# Patient Record
Sex: Male | Born: 1965
Health system: Southern US, Community
[De-identification: ages and names within clinical notes are randomized; demographics above are authoritative.]

## PROBLEM LIST (undated history)

## (undated) DIAGNOSIS — G4730 Sleep apnea, unspecified: Secondary | ICD-10-CM

## (undated) DIAGNOSIS — I10 Essential (primary) hypertension: Secondary | ICD-10-CM

## (undated) DIAGNOSIS — I514 Myocarditis, unspecified: Secondary | ICD-10-CM

## (undated) DIAGNOSIS — N529 Male erectile dysfunction, unspecified: Secondary | ICD-10-CM

## (undated) DIAGNOSIS — G43909 Migraine, unspecified, not intractable, without status migrainosus: Secondary | ICD-10-CM

## (undated) DIAGNOSIS — R7303 Prediabetes: Secondary | ICD-10-CM

## (undated) DIAGNOSIS — M109 Gout, unspecified: Secondary | ICD-10-CM

## (undated) DIAGNOSIS — M2550 Pain in unspecified joint: Secondary | ICD-10-CM

## (undated) HISTORY — DX: Male erectile dysfunction, unspecified: N52.9

## (undated) HISTORY — DX: Sleep apnea, unspecified: G47.30

## (undated) HISTORY — DX: Prediabetes: R73.03

## (undated) HISTORY — DX: Pain in unspecified joint: M25.50

## (undated) HISTORY — DX: Gout, unspecified: M10.9

## (undated) HISTORY — PX: PATELLA FRACTURE SURGERY: SHX735

## (undated) HISTORY — DX: Migraine, unspecified, not intractable, without status migrainosus: G43.909

---

## 2000-09-17 ENCOUNTER — Emergency Department (HOSPITAL_COMMUNITY): Admission: EM | Admit: 2000-09-17 | Discharge: 2000-09-17 | Payer: Self-pay | Admitting: Emergency Medicine

## 2000-09-27 ENCOUNTER — Emergency Department (HOSPITAL_COMMUNITY): Admission: EM | Admit: 2000-09-27 | Discharge: 2000-09-27 | Payer: Self-pay | Admitting: Emergency Medicine

## 2007-12-19 ENCOUNTER — Emergency Department (HOSPITAL_COMMUNITY): Admission: EM | Admit: 2007-12-19 | Discharge: 2007-12-19 | Payer: Self-pay | Admitting: Emergency Medicine

## 2008-03-08 ENCOUNTER — Emergency Department (HOSPITAL_COMMUNITY): Admission: EM | Admit: 2008-03-08 | Discharge: 2008-03-09 | Payer: Self-pay | Admitting: Emergency Medicine

## 2011-07-03 ENCOUNTER — Encounter: Payer: Self-pay | Admitting: Emergency Medicine

## 2011-07-03 ENCOUNTER — Emergency Department (INDEPENDENT_AMBULATORY_CARE_PROVIDER_SITE_OTHER): Payer: 59

## 2011-07-03 ENCOUNTER — Emergency Department (HOSPITAL_BASED_OUTPATIENT_CLINIC_OR_DEPARTMENT_OTHER)
Admission: EM | Admit: 2011-07-03 | Discharge: 2011-07-03 | Disposition: A | Discharge status: Home / Self Care | Payer: 59 | Attending: Emergency Medicine | Admitting: Emergency Medicine

## 2011-07-03 DIAGNOSIS — I10 Essential (primary) hypertension: Secondary | ICD-10-CM | POA: Insufficient documentation

## 2011-07-03 DIAGNOSIS — M25569 Pain in unspecified knee: Secondary | ICD-10-CM | POA: Insufficient documentation

## 2011-07-03 DIAGNOSIS — M25562 Pain in left knee: Secondary | ICD-10-CM

## 2011-07-03 HISTORY — DX: Essential (primary) hypertension: I10

## 2011-07-03 MED ORDER — NAPROXEN 500 MG PO TABS: 500.0000 mg | ORAL_TABLET | Freq: Two times a day (BID) | ORAL | Status: AC

## 2011-07-03 MED ORDER — HYDROCODONE-ACETAMINOPHEN 5-325 MG PO TABS: 1.0000 | ORAL_TABLET | Freq: Four times a day (QID) | ORAL | Status: AC | PRN

## 2011-07-03 NOTE — ED Notes (Signed)
Pt c/o LT knee pain x 4-5 days; no known recent injury; hx of LT knee fx

## 2011-07-03 NOTE — ED Provider Notes (Signed)
Scribed for Calvin Jakes, MD, the patient was seen in room MH12/MH12 . This chart was scribed by Ellie Lunch. This patient's care was started at 3:18 PM.   CSN: [id number] Arrival date & time: 07/03/2011  2:34 PM  Chief Complaint  Patient presents with  . Knee Pain   HPI  Calvin Hancock is a 45 y.o. male who presents to the Emergency Department complaining of left knee pain starting five days ago without any reported trauma. Pain is described as an "ache" that is currently 8/10 and 10/10 at its worst. Patient reports a history of a left fractured patella in 1995 and states symptoms are similar. Walking, standing, and flexion exacerbates pain. Pain is improved with rest and an antiinflammatory (Rxd for ankle pain by PCP Dr. Tiburcio Pea.   Additionally, pt reports No H/A, CP, No SOB, no abd pain, back neck pain, no hematuria, and no edema.   HPI ELEMENTS:  Location: left knee  Onset: 5 days ago Timing: waxing and waning  Quality: "ache"  Severity: 10/10 at worse, current 8/10  Modifying factors: walking, standing, flexion exacerbates, rest improves Context:  as above    Past Medical History  Diagnosis Date  . Hypertension     History reviewed. No pertinent past surgical history.  No family history on file.  History  Substance Use Topics  . Smoking status: Never Smoker   . Smokeless tobacco: Not on file  . Alcohol Use: Yes     social      Review of Systems 10 Systems reviewed and are negative for acute change except as noted in the HPI.  Physical Exam  BP 141/94  Pulse 72  Temp(Src) 97.8 F (36.6 C) (Oral)  Resp 16  SpO2 99%  Physical Exam  Constitutional: He is oriented to person, place, and time. He appears well-developed and well-nourished. No distress.  HENT:  Head: Normocephalic and atraumatic.  Eyes: EOM are normal. Pupils are equal, round, and reactive to light.  Cardiovascular: Normal rate, regular rhythm and normal heart sounds.   Abdominal: Soft.  Bowel sounds are normal. There is no tenderness.  Musculoskeletal:       Slight swelling left knee. Left quadricep tendon tender. Left patella moves freely. No numbness in left foot.  Lymphadenopathy:    He has no cervical adenopathy.  Neurological: He is alert and oriented to person, place, and time. No cranial nerve deficit.   OTHER DATA REVIEWED: Nursing notes, vital signs, and past medical records reviewed.   DIAGNOSTIC STUDIES: Oxygen Saturation is 99% on room air, normal by my interpretation.     LABS / RADIOLOGY:  No results found for this or any previous visit. Dg Knee Complete 4 Views Left  07/03/2011  *RADIOLOGY REPORT*  Clinical Data: Left knee pain, no trauma  LEFT KNEE - COMPLETE 4+ VIEW  Comparison: None.  Findings: Small suprapatellar effusion noted.  No fracture or dislocation.  No radiopaque foreign body.  IMPRESSION: No focal acute finding.  Original Report Authenticated By: Harrel Lemon, M.D.    ED COURSE / COORDINATION OF CARE: 16:32 EDP at bedside to discuss radiology report with PT.   MDM: PETELLAR TENDON IRRITATION. NO BONE INJURY  IMPRESSION: Diagnoses that have been ruled out:  Diagnoses that are still under consideration:  Final diagnoses:    PLAN:  Home  The patient is to return the emergency department if there is any worsening of symptoms. I have reviewed the discharge instructions with the patient  CONDITION  ON DISCHARGE: Stable  MEDICATIONS GIVEN IN THE E.D.  Medications  Amlodipine-Olmesartan (AZOR PO) (not administered)  amLODipine-olmesartan (AZOR) 5-40 MG per tablet (not administered)  meloxicam (MOBIC) 15 MG tablet (not administered)    DISCHARGE MEDICATIONS: New Prescriptions   No medications on file    Procedures      I personally performed the services described in this documentation, which was scribed in my presence. The recorded information has been reviewed and considered. Calvin Jakes, MD    Calvin Jakes, MD 07/03/11 [phone]

## 2013-08-29 ENCOUNTER — Ambulatory Visit
Admission: RE | Admit: 2013-08-29 | Discharge: 2013-08-29 | Disposition: A | Discharge status: Home / Self Care | Payer: PRIVATE HEALTH INSURANCE | Source: Ambulatory Visit | Attending: Family Medicine | Admitting: Family Medicine

## 2013-08-29 ENCOUNTER — Other Ambulatory Visit: Payer: Self-pay | Admitting: Family Medicine

## 2013-08-29 DIAGNOSIS — M25572 Pain in left ankle and joints of left foot: Secondary | ICD-10-CM

## 2013-12-18 ENCOUNTER — Ambulatory Visit
Admission: RE | Admit: 2013-12-18 | Discharge: 2013-12-18 | Disposition: A | Discharge status: Home / Self Care | Payer: BC Managed Care – PPO | Source: Ambulatory Visit | Attending: Family Medicine | Admitting: Family Medicine

## 2013-12-18 ENCOUNTER — Other Ambulatory Visit: Payer: Self-pay | Admitting: Family Medicine

## 2013-12-18 DIAGNOSIS — M25561 Pain in right knee: Secondary | ICD-10-CM

## 2013-12-18 DIAGNOSIS — M25571 Pain in right ankle and joints of right foot: Secondary | ICD-10-CM

## 2014-07-25 ENCOUNTER — Encounter: Payer: Self-pay | Admitting: *Deleted

## 2015-09-01 ENCOUNTER — Ambulatory Visit
Admission: RE | Admit: 2015-09-01 | Discharge: 2015-09-01 | Disposition: A | Discharge status: Home / Self Care | Payer: BLUE CROSS/BLUE SHIELD | Source: Ambulatory Visit | Attending: Family Medicine | Admitting: Family Medicine

## 2015-09-01 ENCOUNTER — Other Ambulatory Visit: Payer: Self-pay | Admitting: Family Medicine

## 2015-09-01 DIAGNOSIS — R0602 Shortness of breath: Secondary | ICD-10-CM

## 2015-12-14 ENCOUNTER — Ambulatory Visit (INDEPENDENT_AMBULATORY_CARE_PROVIDER_SITE_OTHER)
Admission: RE | Admit: 2015-12-14 | Discharge: 2015-12-14 | Disposition: A | Discharge status: Home / Self Care | Payer: BLUE CROSS/BLUE SHIELD | Source: Ambulatory Visit | Attending: Internal Medicine | Admitting: Internal Medicine

## 2015-12-14 ENCOUNTER — Encounter: Payer: Self-pay | Admitting: Internal Medicine

## 2015-12-14 ENCOUNTER — Ambulatory Visit (INDEPENDENT_AMBULATORY_CARE_PROVIDER_SITE_OTHER): Payer: BLUE CROSS/BLUE SHIELD | Admitting: Internal Medicine

## 2015-12-14 ENCOUNTER — Other Ambulatory Visit (INDEPENDENT_AMBULATORY_CARE_PROVIDER_SITE_OTHER): Payer: BLUE CROSS/BLUE SHIELD

## 2015-12-14 VITALS — BP 146/82 | HR 71 | Ht 70.5 in | Wt 320.0 lb

## 2015-12-14 DIAGNOSIS — R0600 Dyspnea, unspecified: Secondary | ICD-10-CM

## 2015-12-14 LAB — BASIC METABOLIC PANEL
BUN: 15 mg/dL (ref 6–23)
CO2: 29 mEq/L (ref 19–32)
Calcium: 9 mg/dL (ref 8.4–10.5)
Chloride: 105 mEq/L (ref 96–112)
Creatinine, Ser: 1.12 mg/dL (ref 0.40–1.50)
GFR: 90.14 mL/min (ref >60.00)
Glucose, Bld: 95 mg/dL (ref 70–99)
Potassium: 4.1 mEq/L (ref 3.5–5.1)
Sodium: 141 mEq/L (ref 135–145)

## 2015-12-14 LAB — CBC WITH DIFFERENTIAL/PLATELET
Basophils Absolute: 0 10*3/uL (ref 0.0–0.1)
Basophils Relative: 0.5 % (ref 0.0–3.0)
Eosinophils Absolute: 0.1 10*3/uL (ref 0.0–0.7)
Eosinophils Relative: 1.6 % (ref 0.0–5.0)
HCT: 46 % (ref 39.0–52.0)
Hemoglobin: 14.9 g/dL (ref 13.0–17.0)
Lymphocytes Relative: 42.2 % (ref 12.0–46.0)
Lymphs Abs: 2.7 10*3/uL (ref 0.7–4.0)
MCHC: 32.5 g/dL (ref 30.0–36.0)
MCV: 84.9 fl (ref 78.0–100.0)
Monocytes Absolute: 0.5 10*3/uL (ref 0.1–1.0)
Monocytes Relative: 8.4 % (ref 3.0–12.0)
Neutro Abs: 3 10*3/uL (ref 1.4–7.7)
Neutrophils Relative %: 47.3 % (ref 43.0–77.0)
Platelets: 260 10*3/uL (ref 150.0–400.0)
RBC: 5.42 Mil/uL (ref 4.22–5.81)
RDW: 15.3 % (ref 11.5–15.5)
WBC: 6.4 10*3/uL (ref 4.0–10.5)

## 2015-12-14 LAB — TSH: TSH: 1.43 u[IU]/mL (ref 0.35–4.50)

## 2015-12-14 LAB — D-DIMER, QUANTITATIVE: D-Dimer, Quant: 0.27 ug/mL-FEU (ref 0.00–0.48)

## 2015-12-14 LAB — BRAIN NATRIURETIC PEPTIDE: Pro B Natriuretic peptide (BNP): 8 pg/mL (ref 0.0–100.0)

## 2015-12-14 NOTE — Progress Notes (Signed)
Quick Note:  Spoke with pt and notified of results per Dr. Wert. Pt verbalized understanding and denied any questions.  ______ 

## 2015-12-14 NOTE — Progress Notes (Signed)
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 Advice Only    Referred by Dr. Tiburcio Pea for increased SOB Xfew months.  pt states this is worse with exertion, especially stairs.   no longer exerting at all/ knee slows him down more than anything / wears cpap and wakes up feeling good when uses it   No obvious  day to day or daytime variabilty or assoc chronic cough or cp or chest tightness, subjective wheeze overt sinus or hb symptoms. No unusual exp hx or h/o childhood pna/ asthma or knowledge of premature birth.  Sleeping ok without nocturnal  or early am exacerbation  of respiratory  c/o's or need for noct saba. Also denies any obvious fluctuation of symptoms with weather or environmental changes or other aggravating or alleviating factors except as outlined above   Current Medications, Allergies, Complete Past Medical History, Past Surgical History, Family History, and Social History were reviewed in [employer] record.           Review of Systems  Constitutional: Negative for fever and unexpected weight change.  HENT: Negative for congestion, dental problem, ear pain, nosebleeds, postnasal drip, rhinorrhea, sinus pressure, sneezing, sore throat and trouble swallowing.   Eyes: Negative for redness and itching.  Respiratory: Positive for shortness of breath. Negative for cough, chest tightness and wheezing.   Cardiovascular: Negative for palpitations and leg swelling.  Gastrointestinal: Negative for nausea and vomiting.    Genitourinary: Negative for dysuria.  Musculoskeletal: Negative for joint swelling.  Skin: Negative for rash.  Neurological: Negative for headaches.  Hematological: Does not bruise/bleed easily.  Psychiatric/Behavioral: Negative for dysphoric mood. The patient is not nervous/anxious.        Objective:   Physical Exam  amb bm nad  Wt Readings from Last 3 Encounters:  12/14/15 320 lb (145.151 kg)    Vital signs reviewed   HEENT: nl dentition, turbinates, and oropharynx. Nl external ear canals without cough reflex   NECK :  without JVD/Nodes/TM/ nl carotid upstrokes bilaterally   LUNGS: no acc muscle use,  Nl contour chest which is clear to A and P bilaterally without cough on insp or exp maneuvers   CV:  RRR  no s3 or murmur or increase in P2, no edema   ABD:  Obese/ soft and nontender with nl inspiratory excursion in the supine position. No bruits or organomegaly, bowel sounds nl  MS:  Nl gait/ ext warm without deformities, calf tenderness, cyanosis or clubbing No obvious joint restrictions   SKIN: warm and dry without lesions    NEURO:  alert, approp, nl sensorium with  no motor deficits   CXR PA and Lateral:   12/14/2015 :    I personally reviewed images and agree with radiology impression as follows:    Chronic eventration right hemidiaphragm. Lungs are clear without infiltrate or effusion. Negative for mass or adenopathy. Heart size and vascularity normal.     Labs ordered/ reviewed:  Chemistry      Component Value Date/Time   NA 141 12/14/2015 1005   K 4.1 12/14/2015 1005   CL 105 12/14/2015 1005   CO2 29 12/14/2015 1005   BUN 15 12/14/2015 1005   CREATININE 1.12 12/14/2015 1005      Component Value Date/Time   CALCIUM 9.0 12/14/2015 1005        Lab Results  Component Value Date   WBC 6.4 12/14/2015   HGB 14.9 12/14/2015   HCT 46.0 12/14/2015   MCV 84.9 12/14/2015   PLT 260.0 12/14/2015     Lab Results  Component Value Date    DDIMER 0.27 12/14/2015      Lab Results  Component Value Date   TSH 1.43 12/14/2015     Lab Results  Component Value Date   PROBNP 8.0 12/14/2015                 Assessment & Plan:

## 2015-12-14 NOTE — Patient Instructions (Addendum)
Please remember to go to the lab and x-ray department downstairs for your tests - we will call you with the results when they are available.     To get the most out of exercise, you need to be continuously aware that you are short of breath, but never out of breath, for 30 minutes daily. As you improve, it will actually be easier for you to do the same amount of exercise  in  30 minutes so always push to the level where you are short of breath.  If this does not result in improvement in tolerance, call [facility] at [phone] to schedule a formal stress test

## 2015-12-15 NOTE — Assessment & Plan Note (Addendum)
12/14/2015  Walked RA x 3 laps @ 185 ft each stopped due to End of study, mod fast  pace, no sob or desat      Symptoms are markedly disproportionate to objective findings and not clear this is a lung problem but pt does appear to have difficult airway management issues. DDX of  difficult airways management almost all start with A and  include Adherence, Ace Inhibitors, Acid Reflux, Active Sinus Disease, Alpha 1 Antitripsin deficiency, Anxiety masquerading as Airways dz,  ABPA,  Allergy(esp in young), Aspiration (esp in elderly), Adverse effects of meds,  Active smokers, A bunch of PE's (a small clot burden can't cause this syndrome unless there is already severe underlying pulm or vascular dz with poor reserve) plus two Bs  = Bronchiectasis and Beta blocker use..and one C= CHF  Adherence is always the initial "prime suspect" and is a multilayered concern that requires a "trust but verify" approach in every patient - starting with knowing how to use medications, especially inhalers, correctly, keeping up with refills and understanding the fundamental difference between maintenance and prns vs those medications only taken for a very short course and then stopped and not refilled.   ? Acid (or non-acid) GERD > always difficult to exclude as up to 75% of pts in some series report no assoc GI/ Heartburn symptoms> rec continue max (24h)  acid suppression and diet restrictions/ reviewed     ? ACEi > on ARB should not be issue  ? 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 A bunch of PE's >  D dimer nl - while  A nl valute  may miss small peripheral pe, the clot burden with sob is moderately high and the d dimer has a very high neg pred value in this  setting    ? CHF > excluded by bnp so slow  I had an extended discussion with the patient reviewing all relevant studies completed to date and  lasting 35 minutes of a60 minute initial office visit    Each maintenance medication was reviewed in detail including most importantly the difference between maintenance and prns and under what circumstances the prns are to be triggered using an action plan format that is not reflected in the computer generated alphabetically organized AVS.    Please see instructions for details which were reviewed in writing and the patient given a copy highlighting the part that I personally wrote and discussed at today's ov.   Pulmonary f/u not needed unless no progress with reg ex and would try CPST next if knee will let him if not satisfied with rx

## 2015-12-15 NOTE — Assessment & Plan Note (Signed)
Body mass index is 45.25 kg/(m^2).  Lab Results  Component Value Date   TSH 1.43 12/14/2015     Contributing to gerd tendency/ doe/reviewed the need and the process to achieve and maintain neg calorie balance > defer f/u primary care including intermittently monitoring thyroid status

## 2016-12-01 ENCOUNTER — Ambulatory Visit: Payer: BLUE CROSS/BLUE SHIELD | Admitting: Podiatry

## 2016-12-05 ENCOUNTER — Ambulatory Visit (INDEPENDENT_AMBULATORY_CARE_PROVIDER_SITE_OTHER): Payer: BLUE CROSS/BLUE SHIELD

## 2016-12-05 ENCOUNTER — Encounter: Payer: Self-pay | Admitting: Podiatry

## 2016-12-05 ENCOUNTER — Ambulatory Visit (INDEPENDENT_AMBULATORY_CARE_PROVIDER_SITE_OTHER): Payer: BLUE CROSS/BLUE SHIELD | Admitting: Podiatry

## 2016-12-05 VITALS — BP 128/77 | HR 76 | Resp 16 | Ht 70.5 in | Wt 320.0 lb

## 2016-12-05 DIAGNOSIS — M722 Plantar fascial fibromatosis: Secondary | ICD-10-CM

## 2016-12-05 DIAGNOSIS — M79672 Pain in left foot: Secondary | ICD-10-CM | POA: Diagnosis not present

## 2016-12-05 DIAGNOSIS — M79671 Pain in right foot: Secondary | ICD-10-CM

## 2016-12-05 MED ORDER — TRIAMCINOLONE ACETONIDE 10 MG/ML IJ SUSP
10.0000 mg | Freq: Once | INTRAMUSCULAR | Status: AC
  Administered 2016-12-05: 10 mg

## 2016-12-05 MED ORDER — DICLOFENAC SODIUM 75 MG PO TBEC: 75.0000 mg | DELAYED_RELEASE_TABLET | Freq: Two times a day (BID) | ORAL | 2 refills | Status: DC

## 2016-12-05 NOTE — Patient Instructions (Signed)

## 2016-12-05 NOTE — Progress Notes (Signed)
   Subjective:    Patient ID: Calvin Hancock, male    DOB: [birth year], 51 y.o.   MRN: [id number]  HPI Chief Complaint  Patient presents with  . Foot Pain    Bilateral; arch; pt stated, "Left foot hurts worse; foot bothers all day"; x3-4 months      Review of Systems  Constitutional: Positive for fatigue and unexpected weight change.  Cardiovascular: Positive for leg swelling.  Musculoskeletal: Positive for arthralgias and gait problem.  All other systems reviewed and are negative.      Objective:   Physical Exam        Assessment & Plan:

## 2016-12-06 NOTE — Progress Notes (Signed)
Subjective:     Patient ID: Calvin Hancock, male   DOB: 30-Mar-1966, 51 y.o.   MRN: [id number]  HPI patient presents stating I have a lot of pain in my left heel and arch over the right that's been present for a while and worsened recently. Do not remember specific injury   Review of Systems  All other systems reviewed and are negative.      Objective:   Physical Exam  Constitutional: He is oriented to person, place, and time.  Cardiovascular: Intact distal pulses.   Musculoskeletal: Normal range of motion.  Neurological: He is oriented to person, place, and time.  Skin: Skin is warm.  Nursing note and vitals reviewed.  neurovascular status intact muscle strength adequate range of motion within normal limits with patient noted to have discomfort in the left fascia distal to the insertion to the calcaneus with moderate depression of the arch noted bilateral and mild discomfort on the right also with patient having obesity which is certainly complicating factor. Patient has good digital perfusion well oriented 3     Assessment:     Acute plantar fasciitis left over right with fluid buildup noted distal to the insertion calcaneus    Plan:     H&P x-rays reviewed and injected the fashion 3 mg Kenalog 5 mg Xylocaine and applied fascial brace with instructions and gave instructions on physical therapy. Reappoint for us to recheck again in the next several weeks  X-rays indicate that there is spur plantar heel bilateral with depression of the arch

## 2016-12-15 ENCOUNTER — Ambulatory Visit (INDEPENDENT_AMBULATORY_CARE_PROVIDER_SITE_OTHER): Payer: BLUE CROSS/BLUE SHIELD | Admitting: Podiatry

## 2016-12-15 ENCOUNTER — Encounter: Payer: Self-pay | Admitting: Podiatry

## 2016-12-15 DIAGNOSIS — M722 Plantar fascial fibromatosis: Secondary | ICD-10-CM | POA: Diagnosis not present

## 2016-12-15 NOTE — Progress Notes (Signed)
Subjective:     Patient ID: Calvin Hancock, male   DOB: 03-16-1966, 51 y.o.   MRN: [id number]  HPI patient states I'm doing better but still having pain and I know my flatfeet are part of the problem   Review of Systems     Objective:   Physical Exam Neurovascular status intact muscle strength adequate with flatfoot deformity bilateral with inflammation pain medial band heel left over right    Assessment:     Plantar fasciitis left over right with inflammation fluid around the medial band with structural changes as part of the problem    Plan:     Continue physical therapy supportive shoe gear usage and scanned for custom orthotics to lift up plantar arch and take pressure off the heel

## 2016-12-28 ENCOUNTER — Ambulatory Visit (INDEPENDENT_AMBULATORY_CARE_PROVIDER_SITE_OTHER): Payer: BLUE CROSS/BLUE SHIELD | Admitting: Podiatry

## 2016-12-28 DIAGNOSIS — M722 Plantar fascial fibromatosis: Secondary | ICD-10-CM

## 2016-12-28 NOTE — Patient Instructions (Signed)

## 2016-12-28 NOTE — Progress Notes (Signed)
Patient presents for orthotic pick up.  Verbal and written break in and wear instructions given.  Patient will follow up in 4 weeks if symptoms worsen or fail to improve. 

## 2017-07-27 ENCOUNTER — Ambulatory Visit
Admission: RE | Admit: 2017-07-27 | Discharge: 2017-07-27 | Disposition: A | Discharge status: Home / Self Care | Payer: BLUE CROSS/BLUE SHIELD | Source: Ambulatory Visit | Attending: Family Medicine | Admitting: Family Medicine

## 2017-07-27 ENCOUNTER — Other Ambulatory Visit: Payer: Self-pay | Admitting: Family Medicine

## 2017-07-27 DIAGNOSIS — R52 Pain, unspecified: Secondary | ICD-10-CM

## 2018-03-16 ENCOUNTER — Other Ambulatory Visit: Payer: Self-pay | Admitting: Family Medicine

## 2018-03-16 ENCOUNTER — Ambulatory Visit
Admission: RE | Admit: 2018-03-16 | Discharge: 2018-03-16 | Disposition: A | Discharge status: Home / Self Care | Payer: Managed Care, Other (non HMO) | Source: Ambulatory Visit | Attending: Family Medicine | Admitting: Family Medicine

## 2018-03-16 DIAGNOSIS — R202 Paresthesia of skin: Secondary | ICD-10-CM

## 2019-07-11 ENCOUNTER — Other Ambulatory Visit: Payer: Self-pay | Admitting: Family Medicine

## 2019-07-11 ENCOUNTER — Ambulatory Visit
Admission: RE | Admit: 2019-07-11 | Discharge: 2019-07-11 | Disposition: A | Discharge status: Home / Self Care | Payer: Managed Care, Other (non HMO) | Source: Ambulatory Visit | Attending: Family Medicine | Admitting: Family Medicine

## 2019-07-11 DIAGNOSIS — M25562 Pain in left knee: Secondary | ICD-10-CM

## 2020-05-07 ENCOUNTER — Other Ambulatory Visit: Payer: Self-pay | Admitting: Family Medicine

## 2020-05-07 ENCOUNTER — Ambulatory Visit
Admission: RE | Admit: 2020-05-07 | Discharge: 2020-05-07 | Disposition: A | Discharge status: Home / Self Care | Payer: Managed Care, Other (non HMO) | Source: Ambulatory Visit | Attending: Family Medicine | Admitting: Family Medicine

## 2020-05-07 DIAGNOSIS — M25532 Pain in left wrist: Secondary | ICD-10-CM

## 2020-05-07 DIAGNOSIS — M25531 Pain in right wrist: Secondary | ICD-10-CM

## 2020-07-29 ENCOUNTER — Other Ambulatory Visit: Payer: Managed Care, Other (non HMO)

## 2020-07-30 ENCOUNTER — Other Ambulatory Visit: Payer: Self-pay | Admitting: Critical Care Medicine

## 2020-07-30 ENCOUNTER — Other Ambulatory Visit: Payer: Self-pay

## 2020-07-30 ENCOUNTER — Other Ambulatory Visit: Payer: Managed Care, Other (non HMO)

## 2020-07-30 DIAGNOSIS — Z20822 Contact with and (suspected) exposure to covid-19: Secondary | ICD-10-CM

## 2020-07-31 LAB — SARS-COV-2, NAA 2 DAY TAT

## 2020-07-31 LAB — NOVEL CORONAVIRUS, NAA: SARS-CoV-2, NAA: NOT DETECTED

## 2021-01-23 ENCOUNTER — Other Ambulatory Visit: Payer: Self-pay

## 2021-01-23 ENCOUNTER — Emergency Department (HOSPITAL_COMMUNITY): Payer: Managed Care, Other (non HMO)

## 2021-01-23 ENCOUNTER — Inpatient Hospital Stay (HOSPITAL_COMMUNITY)
Admission: EM | Admit: 2021-01-23 | Discharge: 2021-01-27 | DRG: 286 | Disposition: A | Discharge status: Home / Self Care | Payer: Managed Care, Other (non HMO) | Attending: Cardiology | Admitting: Cardiology

## 2021-01-23 ENCOUNTER — Encounter (HOSPITAL_COMMUNITY): Payer: Self-pay | Admitting: Emergency Medicine

## 2021-01-23 DIAGNOSIS — G4733 Obstructive sleep apnea (adult) (pediatric): Secondary | ICD-10-CM | POA: Diagnosis present

## 2021-01-23 DIAGNOSIS — I248 Other forms of acute ischemic heart disease: Secondary | ICD-10-CM | POA: Diagnosis present

## 2021-01-23 DIAGNOSIS — I493 Ventricular premature depolarization: Secondary | ICD-10-CM | POA: Diagnosis present

## 2021-01-23 DIAGNOSIS — I214 Non-ST elevation (NSTEMI) myocardial infarction: Secondary | ICD-10-CM | POA: Diagnosis present

## 2021-01-23 DIAGNOSIS — Z8249 Family history of ischemic heart disease and other diseases of the circulatory system: Secondary | ICD-10-CM | POA: Diagnosis not present

## 2021-01-23 DIAGNOSIS — R778 Other specified abnormalities of plasma proteins: Secondary | ICD-10-CM | POA: Diagnosis not present

## 2021-01-23 DIAGNOSIS — Z20822 Contact with and (suspected) exposure to covid-19: Secondary | ICD-10-CM | POA: Diagnosis present

## 2021-01-23 DIAGNOSIS — I10 Essential (primary) hypertension: Secondary | ICD-10-CM | POA: Diagnosis not present

## 2021-01-23 DIAGNOSIS — Z6841 Body Mass Index (BMI) 40.0 and over, adult: Secondary | ICD-10-CM

## 2021-01-23 DIAGNOSIS — I959 Hypotension, unspecified: Secondary | ICD-10-CM | POA: Diagnosis present

## 2021-01-23 DIAGNOSIS — I319 Disease of pericardium, unspecified: Secondary | ICD-10-CM | POA: Diagnosis present

## 2021-01-23 DIAGNOSIS — Z79899 Other long term (current) drug therapy: Secondary | ICD-10-CM | POA: Diagnosis not present

## 2021-01-23 DIAGNOSIS — I514 Myocarditis, unspecified: Principal | ICD-10-CM | POA: Diagnosis present

## 2021-01-23 DIAGNOSIS — I5031 Acute diastolic (congestive) heart failure: Secondary | ICD-10-CM | POA: Diagnosis present

## 2021-01-23 DIAGNOSIS — I110 Hypertensive heart disease with heart failure: Secondary | ICD-10-CM | POA: Diagnosis present

## 2021-01-23 DIAGNOSIS — R079 Chest pain, unspecified: Secondary | ICD-10-CM

## 2021-01-23 DIAGNOSIS — R7303 Prediabetes: Secondary | ICD-10-CM | POA: Diagnosis present

## 2021-01-23 DIAGNOSIS — M109 Gout, unspecified: Secondary | ICD-10-CM | POA: Diagnosis present

## 2021-01-23 DIAGNOSIS — R7989 Other specified abnormal findings of blood chemistry: Secondary | ICD-10-CM | POA: Diagnosis present

## 2021-01-23 HISTORY — DX: Other specified abnormal findings of blood chemistry: R79.89

## 2021-01-23 HISTORY — DX: Other specified abnormalities of plasma proteins: R77.8

## 2021-01-23 LAB — LIPASE, BLOOD: Lipase: 31 U/L (ref 11–51)

## 2021-01-23 LAB — CBC
HCT: 45 % (ref 39.0–52.0)
Hemoglobin: 14.4 g/dL (ref 13.0–17.0)
MCH: 28 pg (ref 26.0–34.0)
MCHC: 32 g/dL (ref 30.0–36.0)
MCV: 87.4 fL (ref 80.0–100.0)
Platelets: 238 10*3/uL (ref 150–400)
RBC: 5.15 MIL/uL (ref 4.22–5.81)
RDW: 14.6 % (ref 11.5–15.5)
WBC: 7.7 10*3/uL (ref 4.0–10.5)
nRBC: 0 % (ref 0.0–0.2)

## 2021-01-23 LAB — BASIC METABOLIC PANEL
Anion gap: 7 (ref 5–15)
BUN: 15 mg/dL (ref 6–20)
CO2: 32 mmol/L (ref 22–32)
Calcium: 9 mg/dL (ref 8.9–10.3)
Chloride: 100 mmol/L (ref 98–111)
Creatinine, Ser: 1.18 mg/dL (ref 0.61–1.24)
GFR, Estimated: >60 mL/min (ref >60)
Glucose, Bld: 132 mg/dL — ABNORMAL HIGH (ref 70–99)
Potassium: 3.3 mmol/L — ABNORMAL LOW (ref 3.5–5.1)
Sodium: 139 mmol/L (ref 135–145)

## 2021-01-23 LAB — RESP PANEL BY RT-PCR (FLU A&B, COVID) ARPGX2
Influenza A by PCR: NEGATIVE
Influenza B by PCR: NEGATIVE
SARS Coronavirus 2 by RT PCR: NEGATIVE

## 2021-01-23 LAB — HEPATIC FUNCTION PANEL
ALT: 23 U/L (ref 0–44)
AST: 24 U/L (ref 15–41)
Albumin: 3.6 g/dL (ref 3.5–5.0)
Alkaline Phosphatase: 42 U/L (ref 38–126)
Bilirubin, Direct: <0.1 mg/dL (ref 0.0–0.2)
Total Bilirubin: 0.3 mg/dL (ref 0.3–1.2)
Total Protein: 6.9 g/dL (ref 6.5–8.1)

## 2021-01-23 LAB — TROPONIN I (HIGH SENSITIVITY)
Troponin I (High Sensitivity): 10 ng/L (ref <18)
Troponin I (High Sensitivity): 27 ng/L — ABNORMAL HIGH (ref <18)
Troponin I (High Sensitivity): 203 ng/L (ref <18)
Troponin I (High Sensitivity): 330 ng/L (ref <18)

## 2021-01-23 MED ORDER — FAMOTIDINE IN NACL 20-0.9 MG/50ML-% IV SOLN
20.0000 mg | Freq: Once | INTRAVENOUS | Status: AC
  Administered 2021-01-23: 20 mg via INTRAVENOUS
  Filled 2021-01-23: qty 50

## 2021-01-23 MED ORDER — HEPARIN BOLUS VIA INFUSION
4000.0000 [IU] | Freq: Once | INTRAVENOUS | Status: AC
  Administered 2021-01-23: 4000 [IU] via INTRAVENOUS
  Filled 2021-01-23: qty 4000

## 2021-01-23 MED ORDER — NITROGLYCERIN IN D5W 200-5 MCG/ML-% IV SOLN
2.0000 ug/min | INTRAVENOUS | Status: DC
  Administered 2021-01-23: 20 ug/min via INTRAVENOUS
  Filled 2021-01-23: qty 250

## 2021-01-23 MED ORDER — NITROGLYCERIN 0.4 MG SL SUBL
0.4000 mg | SUBLINGUAL_TABLET | SUBLINGUAL | Status: DC | PRN
  Administered 2021-01-23 (×2): 0.4 mg via SUBLINGUAL
  Filled 2021-01-23 (×2): qty 1

## 2021-01-23 MED ORDER — TICAGRELOR 90 MG PO TABS: 90.0000 mg | ORAL_TABLET | Freq: Two times a day (BID) | ORAL | Status: DC

## 2021-01-23 MED ORDER — MORPHINE SULFATE (PF) 4 MG/ML IV SOLN
4.0000 mg | Freq: Once | INTRAVENOUS | Status: AC
  Administered 2021-01-23: 4 mg via INTRAVENOUS
  Filled 2021-01-23: qty 1

## 2021-01-23 MED ORDER — TICAGRELOR 90 MG PO TABS
180.0000 mg | ORAL_TABLET | Freq: Once | ORAL | Status: AC
  Administered 2021-01-23: 180 mg via ORAL
  Filled 2021-01-23: qty 2

## 2021-01-23 MED ORDER — LISINOPRIL 10 MG PO TABS
10.0000 mg | ORAL_TABLET | Freq: Every day | ORAL | Status: DC
  Administered 2021-01-24: 10 mg via ORAL
  Filled 2021-01-23: qty 1

## 2021-01-23 MED ORDER — ALUM & MAG HYDROXIDE-SIMETH 200-200-20 MG/5ML PO SUSP
30.0000 mL | Freq: Once | ORAL | Status: AC
  Administered 2021-01-23: 30 mL via ORAL
  Filled 2021-01-23: qty 30

## 2021-01-23 MED ORDER — HEPARIN (PORCINE) 25000 UT/250ML-% IV SOLN
2000.0000 [IU]/h | INTRAVENOUS | Status: DC
  Administered 2021-01-23: 1300 [IU]/h via INTRAVENOUS
  Administered 2021-01-24: 1500 [IU]/h via INTRAVENOUS
  Administered 2021-01-25: 2000 [IU]/h via INTRAVENOUS
  Filled 2021-01-23 (×4): qty 250

## 2021-01-23 MED ORDER — METOPROLOL TARTRATE 12.5 MG HALF TABLET
12.5000 mg | ORAL_TABLET | Freq: Two times a day (BID) | ORAL | Status: DC
  Administered 2021-01-23 – 2021-01-27 (×7): 12.5 mg via ORAL
  Filled 2021-01-23 (×8): qty 1

## 2021-01-23 MED ORDER — HYDROMORPHONE HCL 1 MG/ML IJ SOLN
0.5000 mg | Freq: Once | INTRAMUSCULAR | Status: AC
  Administered 2021-01-23: 0.5 mg via INTRAVENOUS
  Filled 2021-01-23: qty 1

## 2021-01-23 MED ORDER — ASPIRIN 81 MG PO CHEW
81.0000 mg | CHEWABLE_TABLET | Freq: Every day | ORAL | Status: DC
  Administered 2021-01-24 – 2021-01-27 (×3): 81 mg via ORAL
  Filled 2021-01-23 (×4): qty 1

## 2021-01-23 MED ORDER — ASPIRIN 81 MG PO CHEW: 324.0000 mg | CHEWABLE_TABLET | Freq: Once | ORAL | Status: DC

## 2021-01-23 MED ORDER — ASPIRIN 81 MG PO CHEW
324.0000 mg | CHEWABLE_TABLET | Freq: Once | ORAL | Status: AC
  Administered 2021-01-23: 81 mg via ORAL
  Filled 2021-01-23: qty 4

## 2021-01-23 NOTE — ED Notes (Signed)
Pt reported after first dose of SL nitro cp increased from 5/10 to 7/10. Pt reports pain is back to 5/10 and willing to try a 2nd dose of SL nitro for cp relief.

## 2021-01-23 NOTE — ED Notes (Signed)
Paged attending for RN 

## 2021-01-23 NOTE — ED Triage Notes (Signed)
C/o pain to center of chest x 1 hour.  Denies nausea, vomiting, SOB, or any other associated symptoms.

## 2021-01-23 NOTE — ED Provider Notes (Signed)
[facility] EMERGENCY DEPARTMENT Provider Note   CSN: [id number] Arrival date & time: 01/23/21  1331     History Chief Complaint  Patient presents with  . Chest Pain    Calvin Hancock is a 55 y.o. male.  HPI Patient reports he works this morning without difficulty.  He had lunch.  He ate a pork chop biscuit.  He reports about 45 minutes after he ate he got a central chest pressure.  He was just walking to a work line and is started to feel like there was a rock on his chest.  The feeling has been persistent.  He has not had associated symptoms.  No shortness of breath, no lightheadedness, no nausea, no vomiting and no radiation of pain.  No lower extremity swelling.  Patient denies ever having similar symptoms.  He did take 2 tablets of "pain away" with no change in symptoms.     Past Medical History:  Diagnosis Date  . ED (erectile dysfunction)   . Gout   . Hypertension   . Migraine   . Sleep apnea     Patient Active Problem List   Diagnosis Date Noted  . Severe obesity (BMI >= 40) (HCC) 12/15/2015  . Dyspnea 12/14/2015    Past Surgical History:  Procedure Laterality Date  . PATELLA FRACTURE SURGERY         Family History  Problem Relation Age of Onset  . Hypertension Mother   . Diabetes Father     Social History   Tobacco Use  . Smoking status: Never Smoker  . Smokeless tobacco: Never Used  Substance Use Topics  . Alcohol use: Yes    Alcohol/week: 0.0 standard drinks    Comment: social  . Drug use: No    Home Medications Prior to Admission medications   Medication Sig Start Date End Date Taking? Authorizing Provider  amLODipine (NORVASC) 10 MG tablet Take 10 mg by mouth daily. 11/26/20   [provider]  chlorthalidone (HYGROTON) 25 MG tablet Take 25 mg by mouth every morning. 11/20/20   [provider]  CONTRAVE 8-90 MG TB12  12/06/16   [provider]  diclofenac (VOLTAREN) 75 MG EC tablet Take 1 tablet  (75 mg total) by mouth 2 (two) times daily. 12/05/16   Lenn Sink, DPM  esomeprazole (NEXIUM) 20 MG capsule Take 20 mg by mouth daily as needed.    [provider]  fluticasone (FLONASE) 50 MCG/ACT nasal spray Place 1 spray into both nostrils daily as needed.  06/01/10   [provider]  hydrochlorothiazide (HYDRODIURIL) 25 MG tablet Take 25 mg by mouth daily.    [provider]  indomethacin (INDOCIN) 25 MG capsule Take 25 mg by mouth 3 (three) times daily. 01/20/21   [provider]  KLOR-CON M20 20 MEQ tablet Take 20 mEq by mouth daily. 12/13/20   [provider]  Telmisartan-Amlodipine (TWYNSTA) 80-10 MG TABS Take by mouth.    [provider]    Allergies    Patient has no allergy information on record.  Review of Systems   Review of Systems 10 systems reviewed and negative except as per HPI Physical Exam Updated Vital Signs BP (!) 103/53   Pulse 74   Temp 98.3 F (36.8 C) (Oral)   Resp (!) 21   Ht 5\' 10"  (1.778 m)   Wt (!) 146.1 kg   SpO2 95%   BMI 46.20 kg/m   Physical Exam Constitutional:  Comments: Alert nontoxic.  No respiratory distress.  HENT:     Mouth/Throat:     Pharynx: Oropharynx is clear.  Eyes:     Extraocular Movements: Extraocular movements intact.  Cardiovascular:     Rate and Rhythm: Normal rate and regular rhythm.  Pulmonary:     Effort: Pulmonary effort is normal.     Breath sounds: Normal breath sounds.  Abdominal:     General: There is no distension.     Palpations: Abdomen is soft.     Tenderness: There is no abdominal tenderness. There is no guarding.  Musculoskeletal:        General: No swelling or tenderness. Normal range of motion.     Right lower leg: No edema.     Left lower leg: No edema.  Skin:    General: Skin is warm and dry.  Neurological:     General: No focal deficit present.     Mental Status: He is oriented to person, place, and time.     Cranial Nerves: No cranial  nerve deficit.     Coordination: Coordination normal.     ED Results / Procedures / Treatments   Labs (all labs ordered are listed, but only abnormal results are displayed) Labs Reviewed  BASIC METABOLIC PANEL - Abnormal; Notable for the following components:      Result Value   Potassium 3.3 (*)    Glucose, Bld 132 (*)    All other components within normal limits  CBC  HEPATIC FUNCTION PANEL  LIPASE, BLOOD  TROPONIN I (HIGH SENSITIVITY)  TROPONIN I (HIGH SENSITIVITY)    EKG EKG Interpretation  Date/Time:  Saturday January 23 2021 13:39:04 EST Ventricular Rate:  82 PR Interval:  148 QRS Duration: 116 QT Interval:  384 QTC Calculation: 448 R Axis:   100 Text Interpretation: Normal sinus rhythm Rightward axis Incomplete right bundle branch block Borderline ECG agree, no STEMI, no old comparison Confirmed by Arby Barrette [phone]) on 01/23/2021 2:13:07 PM   Radiology DG Chest 2 View  Result Date: 01/23/2021 CLINICAL DATA:  Chest pain EXAM: CHEST - 2 VIEW COMPARISON:  December 14, 2015 FINDINGS: No edema or airspace opacity. Stable eventration of the right hemidiaphragm. Heart size and pulmonary vascularity are normal. No adenopathy. No pneumothorax. No bone lesions. IMPRESSION: Stable eventration of right hemidiaphragm. Lungs clear. Heart size normal. Electronically Signed   By: Bretta Bang III M.D.   On: 01/23/2021 14:16    Procedures Procedures   Medications Ordered in ED Medications  famotidine (PEPCID) IVPB 20 mg premix (has no administration in time range)  morphine 4 MG/ML injection 4 mg (4 mg Intravenous Given 01/23/21 1452)  aspirin chewable tablet 324 mg (81 mg Oral Given 01/23/21 1453)    ED Course  I have reviewed the triage vital signs and the nursing notes.  Pertinent labs & imaging results that were available during my care of the patient were reviewed by me and considered in my medical decision making (see chart for details).    MDM  Rules/Calculators/A&P                         Patient presents with central chest pain.  It has pressure quality.  No associated symptoms.  We will proceed with cardiac work-up.  Patient has risk factors of hypertension and obesity.  No tobacco use.  Rare social alcohol use.  EKG shows right bundle branch block.  No old comparison available.  We  will proceed with aspirin 81 mg (patient described taking 2 "pain away" tablets.  These appear to have aspirin in them).  We will also give Pepcid and morphine for pain relief.  First troponin normal.  Patient will need a second troponin as chest pain just occurred earlier this afternoon.  If symptoms are completely resolved by GI medications and troponins remain negative, anticipate likely discharge with follow-up and initiating PPI.   Final Clinical Impression(s) / ED Diagnoses Final diagnoses:  Nonspecific chest pain    Rx / DC Orders ED Discharge Orders    None       Arby Barrette, MD 01/23/21 1547

## 2021-01-23 NOTE — H&P (Signed)
Cardiology Admission History and Physical:   Patient ID: Calvin Hancock MRN: [id number]; DOB: 1966/01/17   Admission date: 01/23/2021  Primary Care Provider: Johny Blamer, MD [facility] Cardiologist: No primary care provider on file.  [facility] Electrophysiologist:  None   Chief Complaint: chest pain  Patient Profile:   55M with HTN, obesity, and COPD who presents with acute onset chest pain and troponin elevation consistent with NSTEMI.   History of Present Illness:   Calvin Hancock was in his normal state of health at work until around 1300 when he experienced acute onset severe chest pain without radiation.  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  He is constantly walking around checking on staff and has had no prior limitations in physical activity related to chest pain.  Around 12:00 he ate lunch and had a steak biscuit along with some chips and water.  An hour later when his chest pain started he thought that it was indigestion however it was much more severe than prior episodes of indigestion and completely different in character.  His physical activity has only been limited by frequent gout flares over the past few months since he had a significant intentional weight loss prior to vacation in December.  He has had a lot of issues with his left elbow with gout flares.  His chest pain felt like a rock was sitting on his chest and was constant until he was given morphine in the emergency department.  Risk factors are obesity and hypertension.  His father passed from a massive heart attack 3 years ago at age 61 and was a long-term smoker.  Calvin Hancock has no prior tobacco use history.   Past Medical History:  Diagnosis Date  . ED (erectile dysfunction)   . Gout   . Hypertension   . Migraine   . Sleep apnea    Past Surgical History:  Procedure Laterality Date  . PATELLA  FRACTURE SURGERY      Medications Prior to Admission: Prior to Admission medications   Medication Sig Start Date End Date Taking? Authorizing Provider  amLODipine (NORVASC) 10 MG tablet Take 10 mg by mouth daily. 11/26/20  Yes [provider]  chlorthalidone (HYGROTON) 25 MG tablet Take 25 mg by mouth every morning. 11/20/20  Yes [provider]  CONTRAVE 8-90 MG TB12  12/06/16   [provider]  diclofenac (VOLTAREN) 75 MG EC tablet Take 1 tablet (75 mg total) by mouth 2 (two) times daily. 12/05/16   Lenn Sink, DPM  esomeprazole (NEXIUM) 20 MG capsule Take 20 mg by mouth daily as needed.    [provider]  fluticasone (FLONASE) 50 MCG/ACT nasal spray Place 1 spray into both nostrils daily as needed.  06/01/10   [provider]  hydrochlorothiazide (HYDRODIURIL) 25 MG tablet Take 25 mg by mouth daily.    [provider]  indomethacin (INDOCIN) 25 MG capsule Take 25 mg by mouth 3 (three) times daily. 01/20/21   [provider]  KLOR-CON M20 20 MEQ tablet Take 20 mEq by mouth daily. 12/13/20   [provider]  Telmisartan-Amlodipine (TWYNSTA) 80-10 MG TABS Take by mouth.    [provider]    Allergies:   No Known Allergies  Social History:   Social History   Socioeconomic History  . Marital status: Single    Spouse name:  Not on file  . Number of children: Not on file  . Years of education: Not on file  . Highest education level: Not on file  Occupational History  . Not on file  Tobacco Use  . Smoking status: Never Smoker  . Smokeless tobacco: Never Used  Substance and Sexual Activity  . Alcohol use: Yes    Alcohol/week: 0.0 standard drinks    Comment: social  . Drug use: No  . Sexual activity: Not on file  Other Topics Concern  . Not on file  Social History Narrative  . Not on file   Social Determinants of Health   Financial Resource Strain: Not on file  Food Insecurity: Not on file   Transportation Needs: Not on file  Physical Activity: Not on file  Stress: Not on file  Social Connections: Not on file  Intimate Partner Violence: Not on file    Family History:   The patient's family history includes Diabetes in his father; Hypertension in his mother.  Father passed from massive MI at age 65  ROS:   Review of Systems: [y] = yes, [ ]  = no       General: Weight gain [ ] ; Weight loss [ ] ; Anorexia [ ] ; Fatigue [ ] ; Fever [ ] ; Chills [ ] ; Weakness [ ]     Cardiac: Chest pain/pressure [y]; Resting SOB [ ] ; Exertional SOB [ ] ; Orthopnea [ ] ; Pedal Edema [ ] ; Palpitations [ ] ; Syncope [ ] ; Presyncope [ ] ; Paroxysmal nocturnal dyspnea [ ]     Pulmonary: Cough [ ] ; Wheezing [ ] ; Hemoptysis [ ] ; Sputum [ ] ; Snoring [ ]     GI: Vomiting [ ] ; Dysphagia [ ] ; Melena [ ] ; Hematochezia [ ] ; Heartburn [ ] ; Abdominal pain [ ] ; Constipation [ ] ; Diarrhea [ ] ; BRBPR [ ]     GU: Hematuria [ ] ; Dysuria [ ] ; Nocturia [ ]   Vascular: Pain in legs with walking [ ] ; Pain in feet with lying flat [ ] ; Non-healing sores [ ] ; Stroke [ ] ; TIA [ ] ; Slurred speech [ ] ;    Neuro: Headaches [ ] ; Vertigo [ ] ; Seizures [ ] ; Paresthesias [ ] ;Blurred vision [ ] ; Diplopia [ ] ; Vision changes [ ]     Ortho/Skin: Arthritis [ ] ; Joint pain [ ] ; Muscle pain [ ] ; Joint swelling [ ] ; Back Pain [ ] ; Rash [ ]     Psych: Depression [ ] ; Anxiety [ ]     Heme: Bleeding problems [ ] ; Clotting disorders [ ] ; Anemia [ ]     Endocrine: Diabetes [ ] ; Thyroid dysfunction [ ]    Physical Exam/Data:   Vitals:   01/23/21 1730 01/23/21 1800 01/23/21 1900 01/23/21 2000  BP: 100/82 (!) 127/113 128/83 111/69  Pulse: 73 71 76 69  Resp: 19 16 20 15   Temp:      TempSrc:      SpO2: 96% 96% 99% 96%  Weight:      Height:        Intake/Output Summary (Last 24 hours) at 01/23/2021 2137 Last data filed at 01/23/2021 1752 Gross per 24 hour  Intake -  Output 450 ml  Net -450 ml   Last 3 Weights 01/23/2021 12/05/2016 12/14/2015   Weight (lbs) 322 lb 320 lb 320 lb  Weight (kg) 146.058 kg 145.151 kg 145.151 kg     Body mass index is 46.2 kg/m.  General:  Well nourished, well developed, in no acute distress HEENT: normal Lymph: no adenopathy Neck: no JVD Endocrine:  No thryomegaly Vascular: No carotid bruits; FA pulses 2+ bilaterally without bruits  Cardiac:  normal S1, S2; RRR; no murmur  Lungs:  clear to auscultation bilaterally, no wheezing, rhonchi or rales  Abd: soft, nontender, no hepatomegaly  Ext: no LE edema Musculoskeletal:  No deformities, BUE and BLE strength normal and equal Skin: warm and dry  Neuro:  CNs 2-12 intact, no focal abnormalities noted Psych:  Normal affect   EKG:  The ECG that was done showed NSR (HR 82) and incomplete RBBB without STE or depressions   Relevant CV Studies: none  Laboratory Data:  High Sensitivity Troponin:   Recent Labs  Lab 01/23/21 1352 01/23/21 1557 01/23/21 2024  TROPONINIHS 10 27* 203*      Chemistry Recent Labs  Lab 01/23/21 1352  NA 139  K 3.3*  CL 100  CO2 32  GLUCOSE 132*  BUN 15  CREATININE 1.18  CALCIUM 9.0  GFRNONAA >60  ANIONGAP 7    Recent Labs  Lab 01/23/21 1415  PROT 6.9  ALBUMIN 3.6  AST 24  ALT 23  ALKPHOS 42  BILITOT 0.3   Hematology Recent Labs  Lab 01/23/21 1352  WBC 7.7  RBC 5.15  HGB 14.4  HCT 45.0  MCV 87.4  MCH 28.0  MCHC 32.0  RDW 14.6  PLT 238   BNPNo results for input(s): BNP, PROBNP in the last 168 hours.  DDimer No results for input(s): DDIMER in the last 168 hours.  Radiology/Studies:  DG Chest 2 View  Result Date: 01/23/2021 CLINICAL DATA:  Chest pain EXAM: CHEST - 2 VIEW COMPARISON:  December 14, 2015 FINDINGS: No edema or airspace opacity. Stable eventration of the right hemidiaphragm. Heart size and pulmonary vascularity are normal. No adenopathy. No pneumothorax. No bone lesions. IMPRESSION: Stable eventration of right hemidiaphragm. Lungs clear. Heart size normal. Electronically Signed    By: Bretta BangWilliam  Woodruff III M.D.   On: 01/23/2021 14:16       :[id number]}   TIMI Risk Score for Unstable Angina or Non-ST Elevation MI:   The patient's TIMI risk score is 2, which indicates a 8% risk of all cause mortality, new or recurrent myocardial infarction or need for urgent revascularization in the next 14 days.{  Assessment and Plan:   31M with HTN, obesity, and COPD who presents with acute onset chest pain and troponin elevation consistent with NSTEMI.  Calvin Hancock has a very convincing story for typical anginal chest pain.  Risk factors include hypertension and obesity.  He does have family history of CAD with his father passing from a massive MI at age 53.  He does have a history of GERD however his symptoms for completely different from prior reflux symptoms which were very mild.  Blood pressure on arrival was normotensive.  ECG without ischemic changes.  During my initial evaluation he was conversant fairly comfortable however on follow-up he developed worsening chest discomfort for which she was given sublingual nitroglycerin x2 without symptom relief.  He was started on continuous nitroglycerin and given additional narcotics for pain management.  Troponins returned at this time with significant delta consistent with NSTEMI (10->27->203).  He received aspirin in the emergency department was started on heparin bolus/infusion.  He received ticagrelor 180 mg PO load and continued on bid maintenance. He took all of his scheduled HTN meds 03/11 AM (amlo 10 mg PO daily, olmesartan 20 mg PO daily-different than on MAR), and chlorthalidone 25 mg PO daily.  Will work on chest pain tonight  with a titration of nitroglycerin drip with adjunctive narcotics.  If symptoms stabilize then will plan for coronary evaluation on Monday and echo tomorrow.  If chest pain is refractory we will plan for expedited coronary evaluation with NSTEMI refractory to medical management. - hold olmesartan 20 mg PO daily - start  ASA 81 mg PO daily, s/p ASA 324 mg PO load - start ticag 90 mg PO bid, s/p ticag 180 mg PO load - start lisinopril 10 mg PO daily - start metoprolol 12.5 mg PO bid  - order TTE  - continue NG gtt  Severity of Illness: The appropriate patient status for this patient is INPATIENT. Inpatient status is judged to be reasonable and necessary in order to provide the required intensity of service to ensure the patient's safety. The patient's presenting symptoms, physical exam findings, and initial radiographic and laboratory data in the context of their chronic comorbidities is felt to place them at high risk for further clinical deterioration. Furthermore, it is not anticipated that the patient will be medically stable for discharge from the hospital within 2 midnights of admission. The following factors support the patient status of inpatient.   " The patient's presenting symptoms include chest pain. " The worrisome physical exam findings include: chest pain. " The initial radiographic and laboratory data are worrisome because of elevated troponin.  " The chronic co-morbidities include HTN, obesity, OSA.   * I certify that at the point of admission it is my clinical judgment that the patient will require inpatient hospital care spanning beyond 2 midnights from the point of admission due to high intensity of service, high risk for further deterioration and high frequency of surveillance required.*   For questions or updates, please contact [facility] Please consult [email or web address] for contact info under   Signed, Linton Rump, MD  01/23/2021 9:37 PM

## 2021-01-23 NOTE — ED Notes (Signed)
MD Cherly Beach notified of critical trop.

## 2021-01-23 NOTE — Progress Notes (Signed)
Nitro gtt started at 41mcg/min in new IV. BP is 120/73. Set to recheck every hour. Pt reports improvement of chest pain once gtt started. Now reporting 3/10 CP.   Repeat EKG completed.   Repeat troponin results 330. MD notified

## 2021-01-23 NOTE — ED Provider Notes (Signed)
Patient care assumed at 1500. Patient here for evaluation of chest pain described as a rock sitting on his chest about 45 minutes after a meal. Pain is called on ED presentation. Initial troponin is negative. Plan to check repeat troponin and reassess.  Repeat troponin is up trending. Patient does have some chest discomfort on assessment. Given his risk factors, labs plan to admit for ongoing workup. Cardiology consulted for admission   Tilden Fossa, MD 01/23/21 2104

## 2021-01-23 NOTE — Progress Notes (Signed)
ANTICOAGULATION CONSULT NOTE  Pharmacy Consult for Heparin Indication: chest pain/ACS  No Known Allergies  Patient Measurements: Height: 5\' 10"  (177.8 cm) Weight: (!) 146.1 kg (322 lb) IBW/kg (Calculated) : 73 Heparin Dosing Weight: 107.7 kg  Vital Signs: Temp: 98.3 F (36.8 C) (03/12 1336) Temp Source: Oral (03/12 1336) BP: 128/83 (03/12 1900) Pulse Rate: 76 (03/12 1900)  Labs: Recent Labs    01/23/21 1352 01/23/21 1557  HGB 14.4  --   HCT 45.0  --   PLT 238  --   CREATININE 1.18  --   TROPONINIHS 10 27*    Estimated Creatinine Clearance: 105.9 mL/min (by C-G formula based on SCr of 1.18 mg/dL).   Medical History: Past Medical History:  Diagnosis Date  . ED (erectile dysfunction)   . Gout   . Hypertension   . Migraine   . Sleep apnea     Medications:  Scheduled:  . heparin  4,000 Units Intravenous Once    Assessment: Patient is a 36 yom that presents to the ED with c/o cp. Patient was found to have a mild elevation of trop on his repeat trop level. Pharmacy has been asked to dose heparin at this time for ACS.   Goal of Therapy:  Heparin level 0.3-0.7 units/ml Monitor platelets by anticoagulation protocol: Yes   Plan:  - Heparin bolus 4000 units IV x 1 dose - Heparin drip @ 1300 units/hr - Heparin level in ~ 6 hours  - Monitor patient for s/s of bleeding and CBC while on heparin   44 PharmD. BCPS  01/23/2021,7:25 PM

## 2021-01-24 ENCOUNTER — Inpatient Hospital Stay (HOSPITAL_COMMUNITY): Payer: Managed Care, Other (non HMO)

## 2021-01-24 DIAGNOSIS — I10 Essential (primary) hypertension: Secondary | ICD-10-CM | POA: Diagnosis present

## 2021-01-24 DIAGNOSIS — G4733 Obstructive sleep apnea (adult) (pediatric): Secondary | ICD-10-CM | POA: Diagnosis present

## 2021-01-24 DIAGNOSIS — I214 Non-ST elevation (NSTEMI) myocardial infarction: Secondary | ICD-10-CM

## 2021-01-24 LAB — CBC
HCT: 40.9 % (ref 39.0–52.0)
HCT: 40.1 % (ref 39.0–52.0)
Hemoglobin: 13 g/dL (ref 13.0–17.0)
Hemoglobin: 13.3 g/dL (ref 13.0–17.0)
MCH: 27.9 pg (ref 26.0–34.0)
MCH: 28.5 pg (ref 26.0–34.0)
MCHC: 31.8 g/dL (ref 30.0–36.0)
MCHC: 33.2 g/dL (ref 30.0–36.0)
MCV: 87.8 fL (ref 80.0–100.0)
MCV: 85.9 fL (ref 80.0–100.0)
Platelets: 214 10*3/uL (ref 150–400)
Platelets: 208 10*3/uL (ref 150–400)
RBC: 4.66 MIL/uL (ref 4.22–5.81)
RBC: 4.67 MIL/uL (ref 4.22–5.81)
RDW: 15.1 % (ref 11.5–15.5)
RDW: 15.1 % (ref 11.5–15.5)
WBC: 7.7 10*3/uL (ref 4.0–10.5)
WBC: 7.7 10*3/uL (ref 4.0–10.5)
nRBC: 0 % (ref 0.0–0.2)
nRBC: 0 % (ref 0.0–0.2)

## 2021-01-24 LAB — BASIC METABOLIC PANEL
Anion gap: 8 (ref 5–15)
BUN: 17 mg/dL (ref 6–20)
CO2: 27 mmol/L (ref 22–32)
Calcium: 8.5 mg/dL — ABNORMAL LOW (ref 8.9–10.3)
Chloride: 102 mmol/L (ref 98–111)
Creatinine, Ser: 0.97 mg/dL (ref 0.61–1.24)
GFR, Estimated: >60 mL/min (ref >60)
Glucose, Bld: 84 mg/dL (ref 70–99)
Potassium: 3.4 mmol/L — ABNORMAL LOW (ref 3.5–5.1)
Sodium: 137 mmol/L (ref 135–145)

## 2021-01-24 LAB — TROPONIN I (HIGH SENSITIVITY)
Troponin I (High Sensitivity): 487 ng/L (ref <18)
Troponin I (High Sensitivity): 430 ng/L (ref <18)

## 2021-01-24 LAB — HEPARIN LEVEL (UNFRACTIONATED)
Heparin Unfractionated: 0.15 IU/mL — ABNORMAL LOW (ref 0.30–0.70)
Heparin Unfractionated: 0.22 IU/mL — ABNORMAL LOW (ref 0.30–0.70)
Heparin Unfractionated: 0.28 IU/mL — ABNORMAL LOW (ref 0.30–0.70)

## 2021-01-24 LAB — ECHOCARDIOGRAM COMPLETE
Area-P 1/2: 3.99 cm2
Height: 70 in
S' Lateral: 3.6 cm
Weight: 5198.4 oz

## 2021-01-24 MED ORDER — ASPIRIN 81 MG PO CHEW
81.0000 mg | CHEWABLE_TABLET | ORAL | Status: AC
  Administered 2021-01-25: 81 mg via ORAL
  Filled 2021-01-24: qty 1

## 2021-01-24 MED ORDER — SODIUM CHLORIDE 0.9 % IV SOLN: 250.0000 mL | INTRAVENOUS | Status: DC | PRN

## 2021-01-24 MED ORDER — CALCIUM CARBONATE ANTACID 500 MG PO CHEW
1.0000 | CHEWABLE_TABLET | Freq: Three times a day (TID) | ORAL | Status: DC | PRN
  Administered 2021-01-24: 200 mg via ORAL
  Filled 2021-01-24: qty 1

## 2021-01-24 MED ORDER — SODIUM CHLORIDE 0.9% FLUSH: 3.0000 mL | Freq: Two times a day (BID) | INTRAVENOUS | Status: DC

## 2021-01-24 MED ORDER — POTASSIUM CHLORIDE CRYS ER 20 MEQ PO TBCR
40.0000 meq | EXTENDED_RELEASE_TABLET | Freq: Once | ORAL | Status: AC
  Administered 2021-01-24: 40 meq via ORAL
  Filled 2021-01-24: qty 2

## 2021-01-24 MED ORDER — SODIUM CHLORIDE 0.9 % WEIGHT BASED INFUSION
1.0000 mL/kg/h | INTRAVENOUS | Status: DC
  Administered 2021-01-25 (×2): 1 mL/kg/h via INTRAVENOUS

## 2021-01-24 MED ORDER — PANTOPRAZOLE SODIUM 40 MG PO TBEC
40.0000 mg | DELAYED_RELEASE_TABLET | Freq: Every day | ORAL | Status: DC
  Administered 2021-01-24 – 2021-01-27 (×4): 40 mg via ORAL
  Filled 2021-01-24 (×4): qty 1

## 2021-01-24 MED ORDER — SODIUM CHLORIDE 0.9 % WEIGHT BASED INFUSION
3.0000 mL/kg/h | INTRAVENOUS | Status: DC
  Administered 2021-01-25: 3 mL/kg/h via INTRAVENOUS

## 2021-01-24 MED ORDER — SODIUM CHLORIDE 0.9% FLUSH: 3.0000 mL | INTRAVENOUS | Status: DC | PRN

## 2021-01-24 MED ORDER — SODIUM CHLORIDE 0.9 % IV BOLUS
250.0000 mL | Freq: Once | INTRAVENOUS | Status: AC
  Administered 2021-01-24: 250 mL via INTRAVENOUS

## 2021-01-24 MED ORDER — HEPARIN BOLUS VIA INFUSION
2000.0000 [IU] | Freq: Once | INTRAVENOUS | Status: AC
  Administered 2021-01-24: 2000 [IU] via INTRAVENOUS
  Filled 2021-01-24: qty 2000

## 2021-01-24 NOTE — Progress Notes (Signed)
CTSP for recurrent CP that began started shortly after eating, similar to admit symptoms rated 4/10. NTG drip was titrated with improvement to 3/10 but then patient began to register hypotension 76/40 and 70/30. Assessed at bedside. Clinically he has no signs to correlate with the hypotension surprisingly. He is in no extremis and denies any dizziness or dyspnea.  He is lying flat without dyspnea, heart RRR without murmurs, no wheezes, rales or rhonchi, equal pulses bilaterally, normal mentation and not diaphoretic. He states he is beginning to feel better overall. His chest pain continues to subside and is down to 2-3/10. Asked nursing team to turn off NTG, get stat EKG, obtain manual BP, and start fluid bolus. Dr. Flora Lipps also arrived to bedside. 12 lead EKG shows NSR 74bpm with borderline RBBB without acute ST-T changes or change from prior. F/u manual BP 92/58. Per d/w Dr. Flora Lipps, will keep off NTG drip for now, administer total of 500cc bolus of fluids, expedite echo (spoke with echo tech) and follow up next troponin level. He has low suspicion for an alternate process. Will stop lisinopril and add hold parameters for metoprolol. Will also get a repeat CBC given hypotension since he is on heparin/ASA. Continue to hold home Lasix but will supplement potassium. Dr. Flora Lipps is on call the remainder of the day to follow up echocardiogram. For now the plan is to continue plan for cath tomorrow, but may need to expedite if troponin rise is significant, abnormal findings on echocardiogram, or patient's status worsens. Patient is aware to notify for any new or increasing symptoms.

## 2021-01-24 NOTE — Progress Notes (Signed)
ANTICOAGULATION CONSULT NOTE  Pharmacy Consult for Heparin Indication: chest pain/ACS  No Known Allergies  Patient Measurements: Height: 5\' 10"  (177.8 cm) Weight: (!) 147.4 kg (324 lb 14.4 oz) IBW/kg (Calculated) : 73 Heparin Dosing Weight: 107.7 kg  Vital Signs: Temp: 97.7 F (36.5 C) (03/13 0400) Temp Source: Oral (03/13 0400) BP: 101/69 (03/13 0500) Pulse Rate: 80 (03/13 0500)  Labs: Recent Labs    01/23/21 1352 01/23/21 1557 01/23/21 2024 01/23/21 2214 01/24/21 0426  HGB 14.4  --   --   --  13.0  HCT 45.0  --   --   --  40.9  PLT 238  --   --   --  214  HEPARINUNFRC  --   --   --   --  0.15*  CREATININE 1.18  --   --   --   --   TROPONINIHS 10 27* 203* 330*  --     Estimated Creatinine Clearance: 106.5 mL/min (by C-G formula based on SCr of 1.18 mg/dL).   Medical History: Past Medical History:  Diagnosis Date  . ED (erectile dysfunction)   . Gout   . Hypertension   . Migraine   . Sleep apnea     Medications:  Scheduled:  . aspirin  81 mg Oral Daily  . heparin  2,000 Units Intravenous Once  . lisinopril  10 mg Oral Daily  . metoprolol tartrate  12.5 mg Oral BID  . ticagrelor  90 mg Oral BID    Assessment: Patient is a 52 yom that presents to the ED with c/o cp. Patient was found to have a mild elevation of trop on his repeat trop level. Pharmacy has been asked to dose heparin at this time for ACS.   3/13 AM update:  Heparin level low  Goal of Therapy:  Heparin level 0.3-0.7 units/ml Monitor platelets by anticoagulation protocol: Yes   Plan:  -Heparin 2000 units BOLUS -Inc heparin to 1500 units/hr -1400 heparin level  4/13, PharmD, BCPS Clinical Pharmacist Phone: [phone]

## 2021-01-24 NOTE — H&P (View-Only) (Signed)
Cardiology Progress Note  Patient ID: Calvin Hancock MRN: [id number] DOB: 24-Jan-1966 Date of Encounter: 01/24/2021  Primary Cardiologist: No primary care provider on file.  Subjective   Chief Complaint: None.  HPI: Admitted overnight for non-STEMI.  Chest pain-free.  On nitro drip at 20.  EKG without ischemic changes.  ROS:  All other ROS reviewed and negative. Pertinent positives noted in the HPI.     Inpatient Medications  Scheduled Meds: . aspirin  81 mg Oral Daily  . lisinopril  10 mg Oral Daily  . metoprolol tartrate  12.5 mg Oral BID   Continuous Infusions: . heparin 1,500 Units/hr (01/24/21 4431)  . nitroGLYCERIN 20 mcg/min (01/23/21 2310)   PRN Meds: nitroGLYCERIN   Vital Signs   Vitals:   01/24/21 0100 01/24/21 0400 01/24/21 0500 01/24/21 0800  BP: 105/61 109/72 101/69 113/78  Pulse: 68 69 80 77  Resp: 16 11 15 18   Temp:  97.7 F (36.5 C)  97.8 F (36.6 C)  TempSrc:  Oral  Oral  SpO2: 90% 98% 100% 98%  Weight:      Height:        Intake/Output Summary (Last 24 hours) at 01/24/2021 0921 Last data filed at 01/24/2021 01/26/2021 Gross per 24 hour  Intake 336.04 ml  Output 850 ml  Net -513.96 ml   Last 3 Weights 01/24/2021 01/23/2021 01/23/2021  Weight (lbs) 324 lb 14.4 oz 324 lb 11.2 oz 322 lb  Weight (kg) 147.374 kg 147.283 kg 146.058 kg      Telemetry  Overnight telemetry shows sinus rhythm, PVCs noted, which I personally reviewed.   ECG  The most recent ECG shows normal sinus rhythm heart rate 61, no acute ischemic changes or evidence of infarction, incomplete right bundle branch block, which I personally reviewed.   Physical Exam   Vitals:   01/24/21 0100 01/24/21 0400 01/24/21 0500 01/24/21 0800  BP: 105/61 109/72 101/69 113/78  Pulse: 68 69 80 77  Resp: 16 11 15 18   Temp:  97.7 F (36.5 C)  97.8 F (36.6 C)  TempSrc:  Oral  Oral  SpO2: 90% 98% 100% 98%  Weight:      Height:         Intake/Output Summary (Last 24 hours) at 01/24/2021  0921 Last data filed at 01/24/2021 01/26/2021 Gross per 24 hour  Intake 336.04 ml  Output 850 ml  Net -513.96 ml    Last 3 Weights 01/24/2021 01/23/2021 01/23/2021  Weight (lbs) 324 lb 14.4 oz 324 lb 11.2 oz 322 lb  Weight (kg) 147.374 kg 147.283 kg 146.058 kg    Body mass index is 46.62 kg/m.  General: Well nourished, well developed, in no acute distress Head: Atraumatic, normal size  Eyes: PEERLA, EOMI  Neck: Supple, no JVD Endocrine: No thryomegaly Cardiac: Normal S1, S2; RRR; no murmurs, rubs, or gallops Lungs: Clear to auscultation bilaterally, no wheezing, rhonchi or rales  Abd: Soft, nontender, no hepatomegaly  Ext: No edema, pulses 2+ Musculoskeletal: No deformities, BUE and BLE strength normal and equal Skin: Warm and dry, no rashes   Neuro: Alert and oriented to person, place, time, and situation, CNII-XII grossly intact, no focal deficits  Psych: Normal mood and affect   Labs  High Sensitivity Troponin:   Recent Labs  Lab 01/23/21 1352 01/23/21 1557 01/23/21 2024 01/23/21 2214  TROPONINIHS 10 27* 203* 330*     Cardiac EnzymesNo results for input(s): TROPONINI in the last 168 hours. No results for input(s): TROPIPOC in the  last 168 hours.  Chemistry Recent Labs  Lab 01/23/21 1352 01/23/21 1415  NA 139  --   K 3.3*  --   CL 100  --   CO2 32  --   GLUCOSE 132*  --   BUN 15  --   CREATININE 1.18  --   CALCIUM 9.0  --   PROT  --  6.9  ALBUMIN  --  3.6  AST  --  24  ALT  --  23  ALKPHOS  --  42  BILITOT  --  0.3  GFRNONAA >60  --   ANIONGAP 7  --     Hematology Recent Labs  Lab 01/23/21 1352 01/24/21 0426  WBC 7.7 7.7  RBC 5.15 4.66  HGB 14.4 13.0  HCT 45.0 40.9  MCV 87.4 87.8  MCH 28.0 27.9  MCHC 32.0 31.8  RDW 14.6 15.1  PLT 238 214   BNPNo results for input(s): BNP, PROBNP in the last 168 hours.  DDimer No results for input(s): DDIMER in the last 168 hours.   Radiology  DG Chest 2 View  Result Date: 01/23/2021 CLINICAL DATA:  Chest pain  EXAM: CHEST - 2 VIEW COMPARISON:  December 14, 2015 FINDINGS: No edema or airspace opacity. Stable eventration of the right hemidiaphragm. Heart size and pulmonary vascularity are normal. No adenopathy. No pneumothorax. No bone lesions. IMPRESSION: Stable eventration of right hemidiaphragm. Lungs clear. Heart size normal. Electronically Signed   By: Bretta Bang III M.D.   On: 01/23/2021 14:16    Cardiac Studies  Echo pending  Patient Profile  Calvin Hancock is a 55 y.o. male with hypertension and obesity who was admitted on 01/23/2021 with chest pain and elevated troponins consistent with non-STEMI.  Assessment & Plan   1.  Non-STEMI -Admitted with chest pressure and elevated troponins.  EKG without ischemic changes.  Troponins are minimally elevated.  Repeats are pending. -Findings are consistent with non-STEMI. -On heparin.  Was given Brilinta overnight.  We will hold this in anticipation for left heart cath.  He may end up having three-vessel CAD we would not want to delay any CABG. -On aspirin. -Continue beta-blocker. -Currently chest pain-free on nitroglycerin drip at 20. -Telemetry with PVCs. -EKG without elevation of the ST segments.  Overall he appears stable and comfortable. -We will plan for left heart catheterization tomorrow with possible PCI.  N.p.o. at midnight.  He is hemodynamically stable.  He is chest pain-free.  He is electrically stable.  No need for left heart cath today. -A1c, fasting lipid profile and TSH tomorrow.  Continue with daily BMP. -Echo today -Risk and benefits of left heart catheterization discussed with the patient, he is willing to proceed.  Please see consent statement below.  Shared Decision Making/Informed Consent The risks [stroke (1 in 1000), death (1 in 1000), kidney failure [usually temporary] (1 in 500), bleeding (1 in 200), allergic reaction [possibly serious] (1 in 200)], benefits (diagnostic support and management of coronary artery  disease) and alternatives of a cardiac catheterization were discussed in detail with Mr. Burby and he is willing to proceed.  2.  Hypertension -On nitro drip.  BP is controlled.  Restart home medications as able.  3.  Morbid obesity/BMI 47/OSA -Home sleep machine.  Weight loss advised.  FEN -Intravenous fluids for cath -DVT PPx: Heparin drip -Diet: Heart healthy, n.p.o. at midnight -Code: Full  For questions or updates, please contact [facility] Please consult [email or web address] for contact info under  Time Spent with Patient: I have spent a total of 25 minutes with patient reviewing hospital notes, telemetry, EKGs, labs and examining the patient as well as establishing an assessment and plan that was discussed with the patient.  > 50% of time was spent in direct patient care.    Signed, Lenna Gilford. Flora Lipps, MD, [facility]  [facility]  01/24/2021 9:21 AM

## 2021-01-24 NOTE — Progress Notes (Signed)
Pt will wear his home cpap by himself.

## 2021-01-24 NOTE — Progress Notes (Signed)
Patient got his home cpap and will wear it by himself when he is ready to sleep. RT will continue to monitor.

## 2021-01-24 NOTE — Progress Notes (Signed)
Pt is experiencing low blood pressures 76/40 paged dr. Val Eagle neal to make aware.

## 2021-01-24 NOTE — Plan of Care (Signed)

## 2021-01-24 NOTE — Progress Notes (Signed)
ANTICOAGULATION CONSULT NOTE  Pharmacy Consult for Heparin Indication: chest pain/ACS  No Known Allergies  Patient Measurements: Height: 5\' 10"  (177.8 cm) Weight: (!) 147.4 kg (324 lb 14.4 oz) IBW/kg (Calculated) : 73 Heparin Dosing Weight: 107.7 kg  Vital Signs: Temp: 97.8 F (36.6 C) (03/13 0800) Temp Source: Oral (03/13 0800) BP: 118/68 (03/13 1400) Pulse Rate: 71 (03/13 1100)  Labs: Recent Labs    01/23/21 1352 01/23/21 1557 01/23/21 2024 01/23/21 2214 01/24/21 0426 01/24/21 0952 01/24/21 1354  HGB 14.4  --   --   --  13.0  --  13.3  HCT 45.0  --   --   --  40.9  --  40.1  PLT 238  --   --   --  214  --  208  HEPARINUNFRC  --   --   --   --  0.15*  --  0.22*  CREATININE 1.18  --   --   --   --  0.97  --   TROPONINIHS 10   < > 203* 330*  --  487*  --    < > = values in this interval not displayed.    Estimated Creatinine Clearance: 129.5 mL/min (by C-G formula based on SCr of 0.97 mg/dL).   Medical History: Past Medical History:  Diagnosis Date  . ED (erectile dysfunction)   . Gout   . Hypertension   . Migraine   . Sleep apnea     Medications:  Scheduled:  . aspirin  81 mg Oral Daily  . metoprolol tartrate  12.5 mg Oral BID    Assessment: Patient is a 76 yom that presents to the ED with c/o cp. Patient was found to have a mild elevation of trop on his repeat trop level. Pharmacy has been asked to dose heparin at this time for ACS.   Heparin level low, CBC stable and no signs of overt bleeding.   Goal of Therapy:  Heparin level 0.3-0.7 units/ml Monitor platelets by anticoagulation protocol: Yes   Plan:  - Inc heparin to 1800 units/hr - 2200 heparin level - daily HL, CBC - monitor for signs of bleeding  44, PharmD PGY1 Pharmacy Resident 01/24/2021 2:46 PM

## 2021-01-24 NOTE — Progress Notes (Signed)
  Echocardiogram 2D Echocardiogram has been performed.  Delcie Roch 01/24/2021, 2:03 PM

## 2021-01-24 NOTE — Progress Notes (Signed)
Pt is experiencing chest pressure 4 out of 5. Increased Nitro Drip to 49mcg/hr patient has relief from a rate at 5 to 3..on scale from 0-10)

## 2021-01-24 NOTE — Progress Notes (Signed)
Cardiology Progress Note  Patient ID: Calvin Hancock MRN: [id number] DOB: 05/27/1966 Date of Encounter: 01/24/2021  Primary Cardiologist: No primary care provider on file.  Subjective   Chief Complaint: None.  HPI: Admitted overnight for non-STEMI.  Chest pain-free.  On nitro drip at 20.  EKG without ischemic changes.  ROS:  All other ROS reviewed and negative. Pertinent positives noted in the HPI.     Inpatient Medications  Scheduled Meds: . aspirin  81 mg Oral Daily  . lisinopril  10 mg Oral Daily  . metoprolol tartrate  12.5 mg Oral BID   Continuous Infusions: . heparin 1,500 Units/hr (01/24/21 0628)  . nitroGLYCERIN 20 mcg/min (01/23/21 2310)   PRN Meds: nitroGLYCERIN   Vital Signs   Vitals:   01/24/21 0100 01/24/21 0400 01/24/21 0500 01/24/21 0800  BP: 105/61 109/72 101/69 113/78  Pulse: 68 69 80 77  Resp: 16 11 15 18  Temp:  97.7 F (36.5 C)  97.8 F (36.6 C)  TempSrc:  Oral  Oral  SpO2: 90% 98% 100% 98%  Weight:      Height:        Intake/Output Summary (Last 24 hours) at 01/24/2021 0921 Last data filed at 01/24/2021 0627 Gross per 24 hour  Intake 336.04 ml  Output 850 ml  Net -513.96 ml   Last 3 Weights 01/24/2021 01/23/2021 01/23/2021  Weight (lbs) 324 lb 14.4 oz 324 lb 11.2 oz 322 lb  Weight (kg) 147.374 kg 147.283 kg 146.058 kg      Telemetry  Overnight telemetry shows sinus rhythm, PVCs noted, which I personally reviewed.   ECG  The most recent ECG shows normal sinus rhythm heart rate 61, no acute ischemic changes or evidence of infarction, incomplete right bundle branch block, which I personally reviewed.   Physical Exam   Vitals:   01/24/21 0100 01/24/21 0400 01/24/21 0500 01/24/21 0800  BP: 105/61 109/72 101/69 113/78  Pulse: 68 69 80 77  Resp: 16 11 15 18  Temp:  97.7 F (36.5 C)  97.8 F (36.6 C)  TempSrc:  Oral  Oral  SpO2: 90% 98% 100% 98%  Weight:      Height:         Intake/Output Summary (Last 24 hours) at 01/24/2021  0921 Last data filed at 01/24/2021 0627 Gross per 24 hour  Intake 336.04 ml  Output 850 ml  Net -513.96 ml    Last 3 Weights 01/24/2021 01/23/2021 01/23/2021  Weight (lbs) 324 lb 14.4 oz 324 lb 11.2 oz 322 lb  Weight (kg) 147.374 kg 147.283 kg 146.058 kg    Body mass index is 46.62 kg/m.  General: Well nourished, well developed, in no acute distress Head: Atraumatic, normal size  Eyes: PEERLA, EOMI  Neck: Supple, no JVD Endocrine: No thryomegaly Cardiac: Normal S1, S2; RRR; no murmurs, rubs, or gallops Lungs: Clear to auscultation bilaterally, no wheezing, rhonchi or rales  Abd: Soft, nontender, no hepatomegaly  Ext: No edema, pulses 2+ Musculoskeletal: No deformities, BUE and BLE strength normal and equal Skin: Warm and dry, no rashes   Neuro: Alert and oriented to person, place, time, and situation, CNII-XII grossly intact, no focal deficits  Psych: Normal mood and affect   Labs  High Sensitivity Troponin:   Recent Labs  Lab 01/23/21 1352 01/23/21 1557 01/23/21 2024 01/23/21 2214  TROPONINIHS 10 27* 203* 330*     Cardiac EnzymesNo results for input(s): TROPONINI in the last 168 hours. No results for input(s): TROPIPOC in the   last 168 hours.  Chemistry Recent Labs  Lab 01/23/21 1352 01/23/21 1415  NA 139  --   K 3.3*  --   CL 100  --   CO2 32  --   GLUCOSE 132*  --   BUN 15  --   CREATININE 1.18  --   CALCIUM 9.0  --   PROT  --  6.9  ALBUMIN  --  3.6  AST  --  24  ALT  --  23  ALKPHOS  --  42  BILITOT  --  0.3  GFRNONAA >60  --   ANIONGAP 7  --     Hematology Recent Labs  Lab 01/23/21 1352 01/24/21 0426  WBC 7.7 7.7  RBC 5.15 4.66  HGB 14.4 13.0  HCT 45.0 40.9  MCV 87.4 87.8  MCH 28.0 27.9  MCHC 32.0 31.8  RDW 14.6 15.1  PLT 238 214   BNPNo results for input(s): BNP, PROBNP in the last 168 hours.  DDimer No results for input(s): DDIMER in the last 168 hours.   Radiology  DG Chest 2 View  Result Date: 01/23/2021 CLINICAL DATA:  Chest pain  EXAM: CHEST - 2 VIEW COMPARISON:  December 14, 2015 FINDINGS: No edema or airspace opacity. Stable eventration of the right hemidiaphragm. Heart size and pulmonary vascularity are normal. No adenopathy. No pneumothorax. No bone lesions. IMPRESSION: Stable eventration of right hemidiaphragm. Lungs clear. Heart size normal. Electronically Signed   By: Bretta Bang III M.D.   On: 01/23/2021 14:16    Cardiac Studies  Echo pending  Patient Profile  Calvin Hancock is a 55 y.o. male with hypertension and obesity who was admitted on 01/23/2021 with chest pain and elevated troponins consistent with non-STEMI.  Assessment & Plan   1.  Non-STEMI -Admitted with chest pressure and elevated troponins.  EKG without ischemic changes.  Troponins are minimally elevated.  Repeats are pending. -Findings are consistent with non-STEMI. -On heparin.  Was given Brilinta overnight.  We will hold this in anticipation for left heart cath.  He may end up having three-vessel CAD we would not want to delay any CABG. -On aspirin. -Continue beta-blocker. -Currently chest pain-free on nitroglycerin drip at 20. -Telemetry with PVCs. -EKG without elevation of the ST segments.  Overall he appears stable and comfortable. -We will plan for left heart catheterization tomorrow with possible PCI.  N.p.o. at midnight.  He is hemodynamically stable.  He is chest pain-free.  He is electrically stable.  No need for left heart cath today. -A1c, fasting lipid profile and TSH tomorrow.  Continue with daily BMP. -Echo today -Risk and benefits of left heart catheterization discussed with the patient, he is willing to proceed.  Please see consent statement below.  Shared Decision Making/Informed Consent The risks [stroke (1 in 1000), death (1 in 1000), kidney failure [usually temporary] (1 in 500), bleeding (1 in 200), allergic reaction [possibly serious] (1 in 200)], benefits (diagnostic support and management of coronary artery  disease) and alternatives of a cardiac catheterization were discussed in detail with Calvin Hancock and he is willing to proceed.  2.  Hypertension -On nitro drip.  BP is controlled.  Restart home medications as able.  3.  Morbid obesity/BMI 47/OSA -Home sleep machine.  Weight loss advised.  FEN -Intravenous fluids for cath -DVT PPx: Heparin drip -Diet: Heart healthy, n.p.o. at midnight -Code: Full  For questions or updates, please contact [facility] Please consult [email or web address] for contact info under  Time Spent with Patient: I have spent a total of 25 minutes with patient reviewing hospital notes, telemetry, EKGs, labs and examining the patient as well as establishing an assessment and plan that was discussed with the patient.  > 50% of time was spent in direct patient care.    Signed, Lenna Gilford. Flora Lipps, MD, [facility]  [facility]  01/24/2021 9:21 AM

## 2021-01-24 NOTE — Progress Notes (Signed)
EKG complete after shaving chest

## 2021-01-24 NOTE — Progress Notes (Signed)
ANTICOAGULATION CONSULT NOTE Pharmacy Consult for Heparin Indication: chest pain/ACS  No Known Allergies  Patient Measurements: Height: 5\' 10"  (177.8 cm) Weight: (!) 147.4 kg (324 lb 14.4 oz) IBW/kg (Calculated) : 73 Heparin Dosing Weight: 107.7 kg  Vital Signs: Temp: 97.5 F (36.4 C) (03/13 2003) Temp Source: Oral (03/13 2003) BP: 136/84 (03/13 2003) Pulse Rate: 78 (03/13 2003)  Labs: Recent Labs    01/23/21 1352 01/23/21 1557 01/23/21 2214 01/24/21 0426 01/24/21 0952 01/24/21 1354 01/24/21 2153  HGB 14.4  --   --  13.0  --  13.3  --   HCT 45.0  --   --  40.9  --  40.1  --   PLT 238  --   --  214  --  208  --   HEPARINUNFRC  --   --   --  0.15*  --  0.22* 0.28*  CREATININE 1.18  --   --   --  0.97  --   --   TROPONINIHS 10   < > 330*  --  487* 430*  --    < > = values in this interval not displayed.    Estimated Creatinine Clearance: 129.5 mL/min (by C-G formula based on SCr of 0.97 mg/dL).  Assessment: 55 y.o. male with chest pain for heparin   Goal of Therapy:  Heparin level 0.3-0.7 units/ml Monitor platelets by anticoagulation protocol: Yes   Plan:  Increase Heparin 2000 units/hr Follow-up am labs.  44, PharmD, BCPS  01/24/2021 11:38 PM

## 2021-01-25 ENCOUNTER — Encounter (HOSPITAL_COMMUNITY)
Admission: EM | Disposition: A | Discharge status: Home / Self Care | Payer: Self-pay | Source: Home / Self Care | Attending: Cardiology

## 2021-01-25 DIAGNOSIS — R778 Other specified abnormalities of plasma proteins: Secondary | ICD-10-CM

## 2021-01-25 DIAGNOSIS — I10 Essential (primary) hypertension: Secondary | ICD-10-CM

## 2021-01-25 HISTORY — PX: LEFT HEART CATH AND CORONARY ANGIOGRAPHY: CATH118249

## 2021-01-25 LAB — BASIC METABOLIC PANEL
Anion gap: 8 (ref 5–15)
BUN: 16 mg/dL (ref 6–20)
CO2: 30 mmol/L (ref 22–32)
Calcium: 9 mg/dL (ref 8.9–10.3)
Chloride: 102 mmol/L (ref 98–111)
Creatinine, Ser: 1.24 mg/dL (ref 0.61–1.24)
GFR, Estimated: >60 mL/min (ref >60)
Glucose, Bld: 96 mg/dL (ref 70–99)
Potassium: 3.1 mmol/L — ABNORMAL LOW (ref 3.5–5.1)
Sodium: 140 mmol/L (ref 135–145)

## 2021-01-25 LAB — TSH: TSH: 3.344 u[IU]/mL (ref 0.350–4.500)

## 2021-01-25 LAB — CBC
HCT: 45.1 % (ref 39.0–52.0)
Hemoglobin: 14.4 g/dL (ref 13.0–17.0)
MCH: 27.7 pg (ref 26.0–34.0)
MCHC: 31.9 g/dL (ref 30.0–36.0)
MCV: 86.7 fL (ref 80.0–100.0)
Platelets: 239 10*3/uL (ref 150–400)
RBC: 5.2 MIL/uL (ref 4.22–5.81)
RDW: 15.1 % (ref 11.5–15.5)
WBC: 8 10*3/uL (ref 4.0–10.5)
nRBC: 0 % (ref 0.0–0.2)

## 2021-01-25 LAB — LIPID PANEL
Cholesterol: 171 mg/dL (ref 0–200)
HDL: 45 mg/dL (ref >40)
LDL Cholesterol: 96 mg/dL (ref 0–99)
Total CHOL/HDL Ratio: 3.8 RATIO
Triglycerides: 152 mg/dL — ABNORMAL HIGH (ref <150)
VLDL: 30 mg/dL (ref 0–40)

## 2021-01-25 LAB — HEPARIN LEVEL (UNFRACTIONATED): Heparin Unfractionated: 0.39 IU/mL (ref 0.30–0.70)

## 2021-01-25 LAB — HEMOGLOBIN A1C
Hgb A1c MFr Bld: 6.2 % — ABNORMAL HIGH (ref 4.8–5.6)
Mean Plasma Glucose: 131.24 mg/dL

## 2021-01-25 SURGERY — LEFT HEART CATH AND CORONARY ANGIOGRAPHY
Anesthesia: LOCAL

## 2021-01-25 MED ORDER — SODIUM CHLORIDE 0.9% FLUSH: 3.0000 mL | INTRAVENOUS | Status: DC | PRN

## 2021-01-25 MED ORDER — MIDAZOLAM HCL 2 MG/2ML IJ SOLN
INTRAMUSCULAR | Status: DC | PRN
  Administered 2021-01-25: 1 mg via INTRAVENOUS

## 2021-01-25 MED ORDER — FENTANYL CITRATE (PF) 100 MCG/2ML IJ SOLN
INTRAMUSCULAR | Status: AC
  Filled 2021-01-25: qty 2

## 2021-01-25 MED ORDER — VERAPAMIL HCL 2.5 MG/ML IV SOLN
INTRAVENOUS | Status: DC | PRN
  Administered 2021-01-25: 10 mL via INTRA_ARTERIAL

## 2021-01-25 MED ORDER — ENOXAPARIN SODIUM 40 MG/0.4ML ~~LOC~~ SOLN
40.0000 mg | SUBCUTANEOUS | Status: DC
  Administered 2021-01-26 – 2021-01-27 (×2): 40 mg via SUBCUTANEOUS
  Filled 2021-01-25 (×2): qty 0.4

## 2021-01-25 MED ORDER — VERAPAMIL HCL 2.5 MG/ML IV SOLN
INTRAVENOUS | Status: AC
  Filled 2021-01-25: qty 2

## 2021-01-25 MED ORDER — IOHEXOL 350 MG/ML SOLN
INTRAVENOUS | Status: DC | PRN
  Administered 2021-01-25: 75 mL

## 2021-01-25 MED ORDER — SODIUM CHLORIDE 0.9% FLUSH
3.0000 mL | Freq: Two times a day (BID) | INTRAVENOUS | Status: DC
  Administered 2021-01-25 – 2021-01-27 (×5): 3 mL via INTRAVENOUS

## 2021-01-25 MED ORDER — LABETALOL HCL 5 MG/ML IV SOLN: 10.0000 mg | INTRAVENOUS | Status: AC | PRN

## 2021-01-25 MED ORDER — FENTANYL CITRATE (PF) 100 MCG/2ML IJ SOLN
INTRAMUSCULAR | Status: DC | PRN
  Administered 2021-01-25: 50 ug via INTRAVENOUS

## 2021-01-25 MED ORDER — MIDAZOLAM HCL 2 MG/2ML IJ SOLN
INTRAMUSCULAR | Status: AC
  Filled 2021-01-25: qty 2

## 2021-01-25 MED ORDER — HEPARIN (PORCINE) IN NACL 1000-0.9 UT/500ML-% IV SOLN
INTRAVENOUS | Status: DC | PRN
  Administered 2021-01-25 (×2): 500 mL

## 2021-01-25 MED ORDER — HEPARIN SODIUM (PORCINE) 1000 UNIT/ML IJ SOLN
INTRAMUSCULAR | Status: DC | PRN
  Administered 2021-01-25: 6000 [IU] via INTRAVENOUS

## 2021-01-25 MED ORDER — HEPARIN SODIUM (PORCINE) 1000 UNIT/ML IJ SOLN
INTRAMUSCULAR | Status: AC
  Filled 2021-01-25: qty 1

## 2021-01-25 MED ORDER — HEPARIN (PORCINE) IN NACL 1000-0.9 UT/500ML-% IV SOLN
INTRAVENOUS | Status: AC
  Filled 2021-01-25: qty 1000

## 2021-01-25 MED ORDER — HYDRALAZINE HCL 20 MG/ML IJ SOLN: 10.0000 mg | INTRAMUSCULAR | Status: AC | PRN

## 2021-01-25 MED ORDER — SODIUM CHLORIDE 0.9 % IV SOLN: 250.0000 mL | INTRAVENOUS | Status: DC | PRN

## 2021-01-25 MED ORDER — LIDOCAINE HCL (PF) 1 % IJ SOLN
INTRAMUSCULAR | Status: AC
  Filled 2021-01-25: qty 30

## 2021-01-25 MED ORDER — HEPARIN (PORCINE) IN NACL 1000-0.9 UT/500ML-% IV SOLN
INTRAVENOUS | Status: DC | PRN
  Administered 2021-01-25: 500 mL

## 2021-01-25 MED ORDER — POTASSIUM CHLORIDE CRYS ER 20 MEQ PO TBCR
40.0000 meq | EXTENDED_RELEASE_TABLET | ORAL | Status: AC
  Administered 2021-01-25 (×2): 40 meq via ORAL
  Filled 2021-01-25 (×2): qty 2

## 2021-01-25 MED ORDER — LIDOCAINE HCL (PF) 1 % IJ SOLN
INTRAMUSCULAR | Status: DC | PRN
  Administered 2021-01-25: 2 mL

## 2021-01-25 MED ORDER — FUROSEMIDE 10 MG/ML IJ SOLN
40.0000 mg | Freq: Once | INTRAMUSCULAR | Status: AC
  Administered 2021-01-25: 40 mg via INTRAVENOUS
  Filled 2021-01-25: qty 4

## 2021-01-25 SURGICAL SUPPLY — 9 items
CATH 5FR JL3.5 JR4 ANG PIG MP (CATHETERS) ×2 IMPLANT
DEVICE RAD COMP TR BAND LRG (VASCULAR PRODUCTS) ×2 IMPLANT
GLIDESHEATH SLEND SS 6F .021 (SHEATH) ×2 IMPLANT
GUIDEWIRE INQWIRE 1.5J.035X260 (WIRE) ×1 IMPLANT
INQWIRE 1.5J .035X260CM (WIRE) ×2
KIT HEART LEFT (KITS) ×2 IMPLANT
PACK CARDIAC CATHETERIZATION (CUSTOM PROCEDURE TRAY) ×2 IMPLANT
TRANSDUCER W/STOPCOCK (MISCELLANEOUS) ×2 IMPLANT
TUBING CIL FLEX 10 FLL-RA (TUBING) ×2 IMPLANT

## 2021-01-25 NOTE — Progress Notes (Signed)
ANTICOAGULATION CONSULT NOTE Pharmacy Consult for Heparin Indication: chest pain/ACS  No Known Allergies  Patient Measurements: Height: 5\' 10"  (177.8 cm) Weight: (!) 147.1 kg (324 lb 4.8 oz) IBW/kg (Calculated) : 73 Heparin Dosing Weight: 107.7 kg  Vital Signs: Temp: 98.1 F (36.7 C) (03/14 0333) Temp Source: Tympanic (03/14 0333) BP: 113/68 (03/14 0333) Pulse Rate: 81 (03/14 0333)  Labs: Recent Labs    01/23/21 1352 01/23/21 1352 01/23/21 1557 01/23/21 2214 01/24/21 0426 01/24/21 0952 01/24/21 1354 01/24/21 2153 01/25/21 0324  HGB 14.4  --   --   --  13.0  --  13.3  --  14.4  HCT 45.0  --   --   --  40.9  --  40.1  --  45.1  PLT 238  --   --   --  214  --  208  --  239  HEPARINUNFRC  --    < >  --   --  0.15*  --  0.22* 0.28* 0.39  CREATININE 1.18  --   --   --   --  0.97  --   --  1.24  TROPONINIHS 10  --    < > 330*  --  487* 430*  --   --    < > = values in this interval not displayed.    Estimated Creatinine Clearance: 101.1 mL/min (by C-G formula based on SCr of 1.24 mg/dL).  Assessment: 55 y.o. male with chest pain on heparin. Plans for cath today -heparin level at goal -CBC stable   Goal of Therapy:  Heparin level 0.3-0.7 units/ml Monitor platelets by anticoagulation protocol: Yes   Plan:  -Continue heparin at 2000 units/hr -Daily heparin level and CBC  44, PharmD Clinical Pharmacist **Pharmacist phone directory can now be found on [email or web address] ([email or web address]).  Listed under [facility].

## 2021-01-25 NOTE — Interval H&P Note (Signed)
History and Physical Interval Note:  01/25/2021 11:33 AM  Calvin Hancock  has presented today for surgery, with the diagnosis of NSTEMI.  The various methods of treatment have been discussed with the patient and family. After consideration of risks, benefits and other options for treatment, the patient has consented to  Procedure(s): LEFT HEART CATH AND CORONARY ANGIOGRAPHY (N/A) as a surgical intervention.  The patient's history has been reviewed, patient examined, no change in status, stable for surgery.  I have reviewed the patient's chart and labs.  Questions were answered to the patient's satisfaction.    Cath Lab Visit (complete for each Cath Lab visit)  Clinical Evaluation Leading to the Procedure:   ACS: Yes.    Non-ACS:  N/A  Georgena Weisheit

## 2021-01-25 NOTE — Progress Notes (Addendum)
Cardiology Progress Note  Patient ID: Calvin Hancock MRN: [id number] DOB: Oct 18, 1966 Date of Encounter: 01/25/2021  Primary Cardiologist: No primary care provider on file.  Dr. Flora Lipps  Subjective   Patient seen today on the Cath Lab table prior to her cath.  I then monitored the procedure with Dr. Okey Dupre.  Chief Complaint: No further chest pain.  Did relatively well overnight without being on nitroglycerin.Marland Kitchen  HPI: Admitted overnight 3/12-13/2021 for non-STEMI.  Yesterday had some hypotension in the 70s while on nitroglycerin drip.  This was discontinued.  500 mL bolus administered.  Was stabilized no further episodes overnight     Inpatient Medications  Scheduled Meds: . [MAR Hold] aspirin  81 mg Oral Daily  . [MAR Hold] metoprolol tartrate  12.5 mg Oral BID  . [MAR Hold] pantoprazole  40 mg Oral Q1200  . potassium chloride  40 mEq Oral Q2H  . sodium chloride flush  3 mL Intravenous Q12H   Continuous Infusions: . sodium chloride    . sodium chloride 1 mL/kg/hr (01/25/21 0816)  . heparin 2,000 Units/hr (01/25/21 0426)   PRN Meds: sodium chloride, [MAR Hold] calcium carbonate, fentaNYL, Heparin (Porcine) in NaCl, heparin sodium (porcine), lidocaine (PF), midazolam, [MAR Hold] nitroGLYCERIN, Radial Cocktail/Verapamil only, sodium chloride flush   Vital Signs   Vitals:   01/25/21 0021 01/25/21 0333 01/25/21 0335 01/25/21 1130  BP: 115/64 113/68    Pulse: 65 81    Resp: 15 (!) 23    Temp: 98 F (36.7 C) 98.1 F (36.7 C)    TempSrc: Oral Tympanic    SpO2: 92% 98%  100%  Weight:   (!) 147.1 kg   Height:        Intake/Output Summary (Last 24 hours) at 01/25/2021 1147 Last data filed at 01/25/2021 1000 Gross per 24 hour  Intake 1557.48 ml  Output 1550 ml  Net 7.48 ml   Last 3 Weights 01/25/2021 01/24/2021 01/23/2021  Weight (lbs) 324 lb 4.8 oz 324 lb 14.4 oz 324 lb 11.2 oz  Weight (kg) 147.102 kg 147.374 kg 147.283 kg      Telemetry   Sinus rhythm, rates in the 70s,  occasional PVCs.  I personally reviewed.   ECG   No new tracing  Physical Exam   Vitals:   01/25/21 0021 01/25/21 0333 01/25/21 0335 01/25/21 1130  BP: 115/64 113/68    Pulse: 65 81    Resp: 15 (!) 23    Temp: 98 F (36.7 C) 98.1 F (36.7 C)    TempSrc: Oral Tympanic    SpO2: 92% 98%  100%  Weight:   (!) 147.1 kg   Height:         Intake/Output Summary (Last 24 hours) at 01/25/2021 1147 Last data filed at 01/25/2021 1000 Gross per 24 hour  Intake 1557.48 ml  Output 1550 ml  Net 7.48 ml    Last 3 Weights 01/25/2021 01/24/2021 01/23/2021  Weight (lbs) 324 lb 4.8 oz 324 lb 14.4 oz 324 lb 11.2 oz  Weight (kg) 147.102 kg 147.374 kg 147.283 kg    Body mass index is 46.53 kg/m.    General appearance: alert, cooperative, appears stated age, no distress and Morbidly obese. Neck: no adenopathy, no carotid bruit and no JVD Lungs: clear to auscultation bilaterally, normal percussion bilaterally and Nonlabored, good air movement Heart: regular rate and rhythm, S1, S2 normal, no murmur, click, rub or gallop Abdomen: soft, non-tender; bowel sounds normal; no masses,  no organomegaly Extremities: extremities normal, atraumatic,  no cyanosis or edema Pulses: 2+ and symmetric Neurologic: Grossly normal; normal mood and affect  Labs  High Sensitivity Troponin:   Recent Labs  Lab 01/23/21 1557 01/23/21 2024 01/23/21 2214 01/24/21 0952 01/24/21 1354  TROPONINIHS 27* 203* 330* 487* 430*     Cardiac EnzymesNo results for input(s): TROPONINI in the last 168 hours. No results for input(s): TROPIPOC in the last 168 hours.  Chemistry Recent Labs  Lab 01/23/21 1352 01/23/21 1415 01/24/21 0952 01/25/21 0324  NA 139  --  137 140  K 3.3*  --  3.4* 3.1*  CL 100  --  102 102  CO2 32  --  27 30  GLUCOSE 132*  --  84 96  BUN 15  --  17 16  CREATININE 1.18  --  0.97 1.24  CALCIUM 9.0  --  8.5* 9.0  PROT  --  6.9  --   --   ALBUMIN  --  3.6  --   --   AST  --  24  --   --   ALT  --  23   --   --   ALKPHOS  --  42  --   --   BILITOT  --  0.3  --   --   GFRNONAA >60  --  >60 >60  ANIONGAP 7  --  8 8    Hematology Recent Labs  Lab 01/24/21 0426 01/24/21 1354 01/25/21 0324  WBC 7.7 7.7 8.0  RBC 4.66 4.67 5.20  HGB 13.0 13.3 14.4  HCT 40.9 40.1 45.1  MCV 87.8 85.9 86.7  MCH 27.9 28.5 27.7  MCHC 31.8 33.2 31.9  RDW 15.1 15.1 15.1  PLT 214 208 239   BNPNo results for input(s): BNP, PROBNP in the last 168 hours.  DDimer No results for input(s): DDIMER in the last 168 hours.   Radiology  DG Chest 2 View  Result Date: 01/23/2021 CLINICAL DATA:  Chest pain EXAM: CHEST - 2 VIEW COMPARISON:  December 14, 2015 FINDINGS: No edema or airspace opacity. Stable eventration of the right hemidiaphragm. Heart size and pulmonary vascularity are normal. No adenopathy. No pneumothorax. No bone lesions. IMPRESSION: Stable eventration of right hemidiaphragm. Lungs clear. Heart size normal. Electronically Signed   By: Bretta BangWilliam  Woodruff III M.D.   On: 01/23/2021 14:16   ECHOCARDIOGRAM COMPLETE  Result Date: 01/24/2021    ECHOCARDIOGRAM REPORT   Patient Name:   Calvin Hancock Date of Exam: 01/24/2021 Medical Rec #:  [id number]       Height:       70.0 in Accession #:    [id number]      Weight:       324.9 lb Date of Birth:  [birth year]       BSA:          2.565 m Patient Age:    54 years        BP:           92/58 mmHg Patient Gender: M               HR:           75 bpm. Exam Location:  Inpatient Procedure: 2D Echo Indications:    NSTEMI  History:        Patient has no prior history of Echocardiogram examinations.                 Risk Factors:Sleep Apnea and Hypertension.  Sonographer:  Delcie Roch Referring Phys: [id number] MATTHEW A CARLISLE  Sonographer Comments: Image acquisition challenging due to patient body habitus. IMPRESSIONS  1. Left ventricular ejection fraction, by estimation, is 50 to 55%. The left ventricle has low normal function. Left ventricular endocardial border not  optimally defined to evaluate regional wall motion, probable lateral and anterior wall motion abnormality. There is mild left ventricular hypertrophy. Left ventricular diastolic parameters were normal.  2. Right ventricular systolic function is normal. The right ventricular size is normal. Tricuspid regurgitation signal is inadequate for assessing PA pressure.  3. The mitral valve is normal in structure. No evidence of mitral valve regurgitation. No evidence of mitral stenosis.  4. The aortic valve is normal in structure. Aortic valve regurgitation is not visualized. No aortic stenosis is present.  5. Aortic dilatation noted. There is borderline dilatation of the aortic root, measuring 40 mm.  6. The inferior vena cava is normal in size with greater than 50% respiratory variability, suggesting right atrial pressure of 3 mmHg. FINDINGS  Left Ventricle: Left ventricular ejection fraction, by estimation, is 45 to 50%. The left ventricle has mildly decreased function. Left ventricular endocardial border not optimally defined to evaluate regional wall motion. The left ventricular internal cavity size was normal in size. There is mild left ventricular hypertrophy. Left ventricular diastolic parameters were normal. Right Ventricle: The right ventricular size is normal. No increase in right ventricular wall thickness. Right ventricular systolic function is normal. Tricuspid regurgitation signal is inadequate for assessing PA pressure. Left Atrium: Left atrial size was normal in size. Right Atrium: Right atrial size was normal in size. Pericardium: There is no evidence of pericardial effusion. Mitral Valve: The mitral valve is normal in structure. No evidence of mitral valve regurgitation. No evidence of mitral valve stenosis. Tricuspid Valve: The tricuspid valve is normal in structure. Tricuspid valve regurgitation is not demonstrated. No evidence of tricuspid stenosis. Aortic Valve: The aortic valve is normal in structure.  Aortic valve regurgitation is not visualized. No aortic stenosis is present. Pulmonic Valve: The pulmonic valve was grossly normal. Pulmonic valve regurgitation is trivial. No evidence of pulmonic stenosis. Aorta: Aortic dilatation noted. There is borderline dilatation of the aortic root, measuring 40 mm. Venous: The inferior vena cava is normal in size with greater than 50% respiratory variability, suggesting right atrial pressure of 3 mmHg. IAS/Shunts: No atrial level shunt detected by color flow Doppler.  LEFT VENTRICLE PLAX 2D LVIDd:         5.10 cm  Diastology LVIDs:         3.60 cm  LV e' medial:    12.70 cm/s LV PW:         1.10 cm  LV E/e' medial:  6.2 LV IVS:        1.20 cm  LV e' lateral:   10.40 cm/s LVOT diam:     2.60 cm  LV E/e' lateral: 7.6 LV SV:         105 LV SV Index:   41 LVOT Area:     5.31 cm  RIGHT VENTRICLE RV S prime:     14.00 cm/s TAPSE (M-mode): 2.3 cm LEFT ATRIUM           Index       RIGHT ATRIUM           Index LA diam:      3.30 cm 1.29 cm/m  RA Area:     15.00 cm LA Vol (A4C): 39.7 ml 15.48 ml/m RA  Volume:   37.60 ml  14.66 ml/m  AORTIC VALVE LVOT Vmax:   101.00 cm/s LVOT Vmean:  62.800 cm/s LVOT VTI:    0.197 m  AORTA Ao Root diam: 4.00 cm Ao Asc diam:  3.20 cm MITRAL VALVE MV Area (PHT): 3.99 cm    SHUNTS MV Decel Time: 190 msec    Systemic VTI:  0.20 m MV E velocity: 79.30 cm/s  Systemic Diam: 2.60 cm MV A velocity: 68.60 cm/s MV E/A ratio:  1.16 Weston Brass MD Electronically signed by Weston Brass MD Signature Date/Time: 01/24/2021/2:42:09 PM    Final     Cardiac Studies   Echo 01/24/2021: EF 50 to 55%.  Difficult to assess endocardial border.  Normal RV.  Normal valves.  Mild aortic dilation, but normal for size.  Essentially normal.  Cardiac Cath 01/25/2021: Preliminary review of imaging shows normal coronary arteries with no obstructive disease.  Lower extremity arteries.  No culprit lesion.  Patient Profile  Calvin Hancock is a 55 y.o. male with  hypertension and obesity who was admitted on 01/23/2021 with chest pain and elevated troponins consistent with non-STEMI.  Assessment & Plan   1.  Non-STEMI -> however with nonischemic findings on cath, probably not true non-STEMI. => We will recategorize as elevated troponin  Moderate troponin elevation roughly 400 in the setting of chest pain.  No EKG changes to suggest ischemia. => Cardiac cath today with no obvious CAD noted on preliminary findings.  Discontinue heparin.  Was started on Brilinta.  Not unreasonable unless another etiology is found to continue in that platelet agent but would probably use aspirin Plavix as opposed to Brilinta.  Continue beta-blocker and statin.  2.  Hypertension  BP stable.  Now on home medicines..  3.  Morbid obesity/BMI 47/OSA -Home sleep machine.  Weight loss advised.  FEN -Intravenous fluids for cath -DVT PPx: DC heparin drip, ambulate -Diet: Heart healthy diet -Code: Full  For questions or updates, please contact [facility] Please consult [email or web address] for contact info under    Time Spent with Patient: I have spent a total of 22 minutes with patient reviewing hospital notes, telemetry, EKGs, labs and examining the patient as well as establishing an assessment and plan that was discussed with the patient.  > 50% of time was spent in direct patient care.    Signed, Bryan Lemma, MD   ADDENDUM - LVEDP Elevated in Cath- ? HFpEF related vs. IVF hydration = gentle diuresis today   With normal Coronaries & essentially normal Echo - could consider Cardiac MRI to assess for potential Myocarditis vs. Autolysis of coronary plaque lesion - ischemia.   Bryan Lemma, MD

## 2021-01-25 NOTE — Progress Notes (Signed)
Patient stated he will place himself on CPAP without assistance. RT will monitor as needed.

## 2021-01-25 NOTE — Brief Op Note (Signed)
BRIEF CARDIAC CATHETERIZATION NOTE  01/25/2021  11:58 AM  PATIENT:  Calvin Hancock  55 y.o. male  PRE-OPERATIVE DIAGNOSIS:  NSTEMI  POST-OPERATIVE DIAGNOSIS:  MINOCA  PROCEDURE:  Procedure(s): LEFT HEART CATH AND CORONARY ANGIOGRAPHY (N/A)  SURGEON:  Surgeon(s) and Role:    * Malikye Reppond, Cristal Deer, MD - Primary  FINDINGS: 1. No angiographically significant CAD. 2. Grossly normal LVEF. 3. Mildly to moderately elevated left ventricular filling pressure.  RECOMMENDATIONS: 1. F/u echo. 2. Medical therapy; consider gentle diuresis in the setting of elevated LVEDP.  [name], MD [facility]

## 2021-01-25 NOTE — Progress Notes (Signed)
Patient back to floor from cath lab. Patient in NAD, VS stable and TR band site WDL.

## 2021-01-26 ENCOUNTER — Inpatient Hospital Stay (HOSPITAL_COMMUNITY): Payer: Managed Care, Other (non HMO)

## 2021-01-26 ENCOUNTER — Encounter (HOSPITAL_COMMUNITY): Payer: Self-pay | Admitting: Internal Medicine

## 2021-01-26 DIAGNOSIS — I319 Disease of pericardium, unspecified: Secondary | ICD-10-CM

## 2021-01-26 LAB — D-DIMER, QUANTITATIVE: D-Dimer, Quant: <0.27 ug{FEU}/mL (ref 0.00–0.50)

## 2021-01-26 LAB — BASIC METABOLIC PANEL
Anion gap: 7 (ref 5–15)
BUN: 16 mg/dL (ref 6–20)
CO2: 32 mmol/L (ref 22–32)
Calcium: 9 mg/dL (ref 8.9–10.3)
Chloride: 100 mmol/L (ref 98–111)
Creatinine, Ser: 1.32 mg/dL — ABNORMAL HIGH (ref 0.61–1.24)
GFR, Estimated: >60 mL/min (ref >60)
Glucose, Bld: 96 mg/dL (ref 70–99)
Potassium: 3.8 mmol/L (ref 3.5–5.1)
Sodium: 139 mmol/L (ref 135–145)

## 2021-01-26 LAB — CBC
HCT: 42.2 % (ref 39.0–52.0)
Hemoglobin: 13.4 g/dL (ref 13.0–17.0)
MCH: 27.9 pg (ref 26.0–34.0)
MCHC: 31.8 g/dL (ref 30.0–36.0)
MCV: 87.7 fL (ref 80.0–100.0)
Platelets: 225 10*3/uL (ref 150–400)
RBC: 4.81 MIL/uL (ref 4.22–5.81)
RDW: 15.3 % (ref 11.5–15.5)
WBC: 7.8 10*3/uL (ref 4.0–10.5)
nRBC: 0 % (ref 0.0–0.2)

## 2021-01-26 LAB — MAGNESIUM: Magnesium: 2 mg/dL (ref 1.7–2.4)

## 2021-01-26 MED ORDER — COLCHICINE 0.6 MG PO TABS
0.6000 mg | ORAL_TABLET | Freq: Two times a day (BID) | ORAL | Status: DC
  Administered 2021-01-26 – 2021-01-27 (×2): 0.6 mg via ORAL
  Filled 2021-01-26 (×2): qty 1

## 2021-01-26 MED ORDER — GADOBUTROL 1 MMOL/ML IV SOLN
17.0000 mL | Freq: Once | INTRAVENOUS | Status: AC | PRN
  Administered 2021-01-26: 17 mL via INTRAVENOUS

## 2021-01-26 NOTE — Progress Notes (Addendum)
Cardiology Progress Note  Patient ID: Calvin Hancock MRN: [id number] DOB: 10/16/1966 Date of Encounter: 01/26/2021  Primary Cardiologist: Evalina Field, MD    Subjective   HPI: Admitted overnight 3/12-13/2021 for non-STEMI.     03/15:  No chest pain or sob overnight. Would like to go home.  Inpatient Medications  Scheduled Meds: . aspirin  81 mg Oral Daily  . enoxaparin (LOVENOX) injection  40 mg Subcutaneous Q24H  . metoprolol tartrate  12.5 mg Oral BID  . pantoprazole  40 mg Oral Q1200  . sodium chloride flush  3 mL Intravenous Q12H   Continuous Infusions: . sodium chloride     PRN Meds: sodium chloride, calcium carbonate, nitroGLYCERIN, sodium chloride flush   Vital Signs   Vitals:   01/25/21 1700 01/25/21 2011 01/26/21 0316 01/26/21 1149  BP: (!) 120/52 123/61 104/73 (!) 107/58  Pulse: 75 75 61 64  Resp: _0 Temp:  98.1 F (36.7 C) (!) 97.5 F (36.4 C) 98.2 F (36.8 C)  TempSrc:  Oral Oral Oral  SpO2: 100% 97% 96% 96%  Weight:   (!) 145.9 kg   Height:        Intake/Output Summary (Last 24 hours) at 01/26/2021 1307 Last data filed at 01/26/2021 0903 Gross per 24 hour  Intake 1382.92 ml  Output 3701 ml  Net -2318.08 ml   Last 3 Weights 01/26/2021 01/25/2021 01/24/2021  Weight (lbs) 321 lb 10.4 oz 324 lb 4.8 oz 324 lb 14.4 oz  Weight (kg) 145.9 kg 147.102 kg 147.374 kg      Telemetry   SR, sinus brady 50s -  personally reviewed.   ECG   No new tracing  Physical Exam   Vitals:   01/25/21 1700 01/25/21 2011 01/26/21 0316 01/26/21 1149  BP: (!) 120/52 123/61 104/73 (!) 107/58  Pulse: 75 75 61 64  Resp: _1 Temp:  98.1 F (36.7 C) (!) 97.5 F (36.4 C) 98.2 F (36.8 C)  TempSrc:  Oral Oral Oral  SpO2: 100% 97% 96% 96%  Weight:   (!) 145.9 kg   Height:         Intake/Output Summary (Last 24 hours) at 01/26/2021 1307 Last data filed at 01/26/2021 0903 Gross per 24 hour  Intake 1382.92 ml  Output 3701 ml  Net -2318.08 ml     Last 3 Weights 01/26/2021 01/25/2021 01/24/2021  Weight (lbs) 321 lb 10.4 oz 324 lb 4.8 oz 324 lb 14.4 oz  Weight (kg) 145.9 kg 147.102 kg 147.374 kg    Body mass index is 46.15 kg/m.    General appearance: alert, cooperative and appears stated age Neck: no adenopathy, no carotid bruit, no JVD, supple, symmetrical, trachea midline and thyroid not enlarged, symmetric, no tenderness/mass/nodules Lungs: clear to auscultation bilaterally Heart: regular rate and rhythm, S1, S2 normal, no murmur, click, rub or gallop Abdomen: soft, non-tender; bowel sounds normal; no masses,  no organomegaly Extremities: extremities normal, atraumatic, no cyanosis or edema Pulses: 2+ and symmetric Neurologic: Alert and oriented X 3, normal strength and tone. Normal symmetric reflexes. Normal coordination and gait    Labs  High Sensitivity Troponin:   Recent Labs  Lab 01/23/21 1557 01/23/21 2024 01/23/21 2214 01/24/21 0952 01/24/21 1354  TROPONINIHS 27* 203* 330* 487* 430*      Chemistry Recent Labs  Lab 01/23/21 1415 01/24/21 0952 01/25/21 0324 01/26/21 0324  NA  --  137 140 139  K  --  3.4* 3.1*  3.8  CL  --  102 102 100  CO2  --  27 30 32  GLUCOSE  --  84 96 96  BUN  --  _0 CREATININE  --  0.97 1.24 1.32*  CALCIUM  --  8.5* 9.0 9.0  PROT 6.9  --   --   --   ALBUMIN 3.6  --   --   --   AST 24  --   --   --   ALT 23  --   --   --   ALKPHOS 42  --   --   --   BILITOT 0.3  --   --   --   GFRNONAA  --  >60 >60 >60  ANIONGAP  --  _1 Hematology Recent Labs  Lab 01/24/21 1354 01/25/21 0324 01/26/21 0324  WBC 7.7 8.0 7.8  RBC 4.67 5.20 4.81  HGB 13.3 14.4 13.4  HCT 40.1 45.1 42.2  MCV 85.9 86.7 87.7  MCH 28.5 27.7 27.9  MCHC 33.2 31.9 31.8  RDW 15.1 15.1 15.3  PLT 208 239 225   BNPNo results for input(s): BNP, PROBNP in the last 168 hours.  DDimer No results for input(s): DDIMER in the last 168 hours.  Lab Results  Component Value Date   CHOL 171 01/25/2021   HDL  45 01/25/2021   LDLCALC 96 01/25/2021   TRIG 152 (H) 01/25/2021   CHOLHDL 3.8 01/25/2021   Lab Results  Component Value Date   TSH 3.344 01/25/2021   Lab Results  Component Value Date   HGBA1C 6.2 (H) 01/25/2021    Radiology  CARDIAC CATHETERIZATION  Result Date: 01/25/2021 Conclusions: 1. No angiographically significant coronary artery disease. 2. Low normal left ventricular contraction with moderately elevated filling pressure (LVEDP 25-30 mmHg). Recommendations: 1. Consider cardiac MRI to evaluate for evidence of myocarditis/myopericarditis. 2. Gentle diuresis in the setting of elevated LVEDP and low normal LVEF consistent with acute HFpEF. 3. Primary prevention of coronary artery disease. Nelva Bush, MD [facility]   ECHOCARDIOGRAM COMPLETE  Result Date: 01/24/2021    ECHOCARDIOGRAM REPORT   Patient Name:   Calvin Hancock Date of Exam: 01/24/2021 Medical Rec #:  [id number]       Height:       70.0 in Accession #:    [id number]      Weight:       324.9 lb Date of Birth:  Oct 22, 1966       BSA:          2.565 m Patient Age:    55 years        BP:           92/58 mmHg Patient Gender: M               HR:           75 bpm. Exam Location:  Inpatient Procedure: 2D Echo Indications:    NSTEMI  History:        Patient has no prior history of Echocardiogram examinations.                 Risk Factors:Sleep Apnea and Hypertension.  Sonographer:    Johny Chess Referring Phys: [id number] MATTHEW A CARLISLE  Sonographer Comments: Image acquisition challenging due to patient body habitus. IMPRESSIONS  1. Left ventricular ejection fraction, by estimation, is 50 to 55%. The left ventricle has low normal function. Left ventricular endocardial border not  optimally defined to evaluate regional wall motion, probable lateral and anterior wall motion abnormality. There is mild left ventricular hypertrophy. Left ventricular diastolic parameters were normal.  2. Right ventricular systolic function is normal.  The right ventricular size is normal. Tricuspid regurgitation signal is inadequate for assessing PA pressure.  3. The mitral valve is normal in structure. No evidence of mitral valve regurgitation. No evidence of mitral stenosis.  4. The aortic valve is normal in structure. Aortic valve regurgitation is not visualized. No aortic stenosis is present.  5. Aortic dilatation noted. There is borderline dilatation of the aortic root, measuring 40 mm.  6. The inferior vena cava is normal in size with greater than 50% respiratory variability, suggesting right atrial pressure of 3 mmHg. FINDINGS  Left Ventricle: Left ventricular ejection fraction, by estimation, is 45 to 50%. The left ventricle has mildly decreased function. Left ventricular endocardial border not optimally defined to evaluate regional wall motion. The left ventricular internal cavity size was normal in size. There is mild left ventricular hypertrophy. Left ventricular diastolic parameters were normal. Right Ventricle: The right ventricular size is normal. No increase in right ventricular wall thickness. Right ventricular systolic function is normal. Tricuspid regurgitation signal is inadequate for assessing PA pressure. Left Atrium: Left atrial size was normal in size. Right Atrium: Right atrial size was normal in size. Pericardium: There is no evidence of pericardial effusion. Mitral Valve: The mitral valve is normal in structure. No evidence of mitral valve regurgitation. No evidence of mitral valve stenosis. Tricuspid Valve: The tricuspid valve is normal in structure. Tricuspid valve regurgitation is not demonstrated. No evidence of tricuspid stenosis. Aortic Valve: The aortic valve is normal in structure. Aortic valve regurgitation is not visualized. No aortic stenosis is present. Pulmonic Valve: The pulmonic valve was grossly normal. Pulmonic valve regurgitation is trivial. No evidence of pulmonic stenosis. Aorta: Aortic dilatation noted. There is  borderline dilatation of the aortic root, measuring 40 mm. Venous: The inferior vena cava is normal in size with greater than 50% respiratory variability, suggesting right atrial pressure of 3 mmHg. IAS/Shunts: No atrial level shunt detected by color flow Doppler.  LEFT VENTRICLE PLAX 2D LVIDd:         5.10 cm  Diastology LVIDs:         3.60 cm  LV e' medial:    12.70 cm/s LV PW:         1.10 cm  LV E/e' medial:  6.2 LV IVS:        1.20 cm  LV e' lateral:   10.40 cm/s LVOT diam:     2.60 cm  LV E/e' lateral: 7.6 LV SV:         105 LV SV Index:   41 LVOT Area:     5.31 cm  RIGHT VENTRICLE RV S prime:     14.00 cm/s TAPSE (M-mode): 2.3 cm LEFT ATRIUM           Index       RIGHT ATRIUM           Index LA diam:      3.30 cm 1.29 cm/m  RA Area:     15.00 cm LA Vol (A4C): 39.7 ml 15.48 ml/m RA Volume:   37.60 ml  14.66 ml/m  AORTIC VALVE LVOT Vmax:   101.00 cm/s LVOT Vmean:  62.800 cm/s LVOT VTI:    0.197 m  AORTA Ao Root diam: 4.00 cm Ao Asc diam:  3.20 cm MITRAL VALVE  MV Area (PHT): 3.99 cm    SHUNTS MV Decel Time: 190 msec    Systemic VTI:  0.20 m MV E velocity: 79.30 cm/s  Systemic Diam: 2.60 cm MV A velocity: 68.60 cm/s MV E/A ratio:  1.16 Cherlynn Kaiser MD Electronically signed by Cherlynn Kaiser MD Signature Date/Time: 01/24/2021/2:42:09 PM    Final     Cardiac Studies   Echo 01/24/2021: EF 50 to 55%.  Difficult to assess endocardial border.  Normal RV.  Normal valves.  Mild aortic dilation, but normal for size.  Essentially normal.  Cardiac Cath 01/25/2021: Preliminary review of imaging shows normal coronary arteries with no obstructive disease.  Lower extremity arteries.  No culprit lesion.  Patient Profile  Calvin Hancock is a 55 y.o. male with hypertension and obesity who was admitted on 01/23/2021 with chest pain and elevated troponins consistent with non-STEMI.  Assessment & Plan   Principal Problem:   Elevated troponin level not due myocardial infarction, +chest pain Active Problems:    Severe obesity (BMI >= 40) (HCC)   HTN (hypertension)   OSA (obstructive sleep apnea)    1.  Non-STEMI -> however with nonischemic findings on cath, probably not true non-STEMI. => We will recategorize as ELEVATED TROPONIN  Cardiac MRI ordered not done yet  Continue ASA, BB, HR 50s at times, asymptomatic  For the sake of completion, I am also checking a D-dimer although it may be too late to exclude PE.  Was already on heparin when I met him.  2.  Hypertension  BP stable, SBP currently 100s-120s.    Only on metop 12.5 mg bid, held this am >> OK to continue this  At home was on Benicar 40 mg qd, amlodipine 10 mg qd, chlorthalidone 25 mg qd  Med changes per MD  3.  Morbid obesity/BMI 47/OSA - on CPAP as at home   FEN -DVT PPx: DC heparin drip, ambulate -Diet: Heart healthy diet -Code: Full   For questions or updates, please contact [place] Please consult [email or web address] for contact info under    Time Spent with Patient: I have spent a total of 22 minutes with patient reviewing hospital notes, telemetry, EKGs, labs and examining the patient as well as establishing an assessment and plan that was discussed with the patient.  > 50% of time was spent in direct patient care.    Signed, Rosaria Ferries, PA-C   ATTENDING ATTESTATION  I have seen, examined and evaluated the patient this PM along with Rosaria Ferries, PA-C.  After reviewing all the available data and chart, we discussed the patients laboratory, study & physical findings as well as symptoms in detail. I agree with her findings, examination as well as impression recommendations as per our discussion.    Attending adjustments noted in italics.   Need to figure out what etiology of his troponin elevation was.  Checking cardiac MRI to exclude myocarditis, however also D-dimer to potentially exclude PE.  Based on these results, need to determine dispo.    Glenetta Hew, M.D., M.S. Interventional Cardiologist    Pager # [phone] Phone # [phone] [address] [place], [place] [address]

## 2021-01-26 NOTE — Progress Notes (Signed)
    Dr Delton See read the MRI He has focal pericarditis and myocarditis.  Will add colchicine tonight, MD to review in am and decide on ibuprofen vs colchicine as outpt.  Theodore Demark, PA-C 01/26/2021 7:24 PM

## 2021-01-27 ENCOUNTER — Other Ambulatory Visit: Payer: Self-pay | Admitting: Cardiology

## 2021-01-27 DIAGNOSIS — G4733 Obstructive sleep apnea (adult) (pediatric): Secondary | ICD-10-CM

## 2021-01-27 DIAGNOSIS — I319 Disease of pericardium, unspecified: Secondary | ICD-10-CM

## 2021-01-27 LAB — BASIC METABOLIC PANEL
Anion gap: 9 (ref 5–15)
BUN: 20 mg/dL (ref 6–20)
CO2: 28 mmol/L (ref 22–32)
Calcium: 9 mg/dL (ref 8.9–10.3)
Chloride: 102 mmol/L (ref 98–111)
Creatinine, Ser: 1.2 mg/dL (ref 0.61–1.24)
GFR, Estimated: >60 mL/min (ref >60)
Glucose, Bld: 95 mg/dL (ref 70–99)
Potassium: 4 mmol/L (ref 3.5–5.1)
Sodium: 139 mmol/L (ref 135–145)

## 2021-01-27 MED ORDER — IBUPROFEN 600 MG PO TABS: 600.0000 mg | ORAL_TABLET | Freq: Three times a day (TID) | ORAL | 0 refills | Status: DC

## 2021-01-27 MED ORDER — PANTOPRAZOLE SODIUM 40 MG PO TBEC: 40.0000 mg | DELAYED_RELEASE_TABLET | Freq: Every day | ORAL | 0 refills | Status: DC

## 2021-01-27 MED ORDER — PANTOPRAZOLE SODIUM 40 MG PO TBEC: 40.0000 mg | DELAYED_RELEASE_TABLET | Freq: Every day | ORAL | 2 refills | Status: DC

## 2021-01-27 MED ORDER — IBUPROFEN 600 MG PO TABS: 600.0000 mg | ORAL_TABLET | Freq: Three times a day (TID) | ORAL | Status: DC

## 2021-01-27 MED ORDER — COLCHICINE 0.6 MG PO TABS: 0.6000 mg | ORAL_TABLET | Freq: Two times a day (BID) | ORAL | 0 refills | Status: DC

## 2021-01-27 NOTE — Progress Notes (Signed)
D/C instructions given and reviewed. Tele and IV's removed, tolerated well. Transport notified pt ready for D/C.

## 2021-01-27 NOTE — TOC Benefit Eligibility Note (Signed)
Patient Product/process development scientist completed.    The patient is currently admitted and upon discharge could be taking Colchicine 0.6 mg.  The current 30 day co-pay is, $10.00.   The patient is insured through [employer] and there are no assistance options.  Roland Earl, CPhT Pharmacy Patient Advocate Specialist [place] Antimicrobial Stewardship Team Direct Number: [phone]  Fax: [phone]

## 2021-01-27 NOTE — Plan of Care (Signed)
  Problem: Education: Goal: Knowledge of General Education information will improve Description: Including pain rating scale, medication(s)/side effects and non-pharmacologic comfort measures Outcome: Adequate for Discharge   

## 2021-01-27 NOTE — Discharge Summary (Addendum)
Discharge Summary    Patient ID: Calvin Hancock MRN: [id number]; DOB: Aug 29, 1966  Admit date: 01/23/2021 Discharge date: 01/27/2021  PCP:  Shirline Frees, [place]  Cardiologist:  Evalina Field, MD  Advanced Practice Provider:  No care team member to display Electrophysiologist:  None    Discharge Diagnoses    Principal Problem:   Myopericarditis Active Problems:   Severe obesity (BMI >= 40) (HCC)   Elevated troponin level not due myocardial infarction, +chest pain   HTN (hypertension)   OSA (obstructive sleep apnea)  Diagnostic Studies/Procedures    Cath: 01/25/21  Conclusions: 1. No angiographically significant coronary artery disease. 2. Low normal left ventricular contraction with moderately elevated filling pressure (LVEDP 25-30 mmHg).  Recommendations: 1. Consider cardiac MRI to evaluate for evidence of myocarditis/myopericarditis. 2. Gentle diuresis in the setting of elevated LVEDP and low normal LVEF consistent with acute HFpEF. 3. Primary prevention of coronary artery disease.  Nelva Bush, MD [facility]  Echo: 01/24/21 1.  LVEF ~ 50 to 55% - low normal function. (LV endocardial border not optimally defined to evaluate regional wall motion) , probable lateral and anterior wall motion abnormality. There is mild left ventricular hypertrophy. Left ventricular  diastolic parameters were normal.  2. Right ventricular systolic function is normal. The right ventricular  size is normal. Tricuspid regurgitation signal is inadequate for assessing  PA pressure.  3. The mitral valve is normal in structure. No evidence of mitral valve  regurgitation. No evidence of mitral stenosis.  4. The aortic valve is normal in structure. Aortic valve regurgitation is  not visualized. No aortic stenosis is present.  5. Aortic dilatation noted. There is borderline dilatation of the aortic  root, measuring 40 mm.  6. The inferior vena  cava is normal in size with greater than 50%  respiratory variability, suggesting right atrial pressure of 3 mmHg.  _____________   History of Present Illness     Calvin Hancock is a 55 y.o. male with HTN, obesity, and COPD who presented with acute onset chest pain and troponin elevation consistent with NSTEMI. Mr. Mcquarrie was in his normal state of health at work until around 1300 when he experienced acute onset severe chest pain without radiation.  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  He is constantly walking around checking on staff and has had no prior limitations in physical activity related to chest pain.  Around 12:00 he ate lunch and had a steak biscuit along with some chips and water.  An hour later when his chest pain started he thought that it was indigestion however it was much more severe than prior episodes of indigestion and completely different in character.  His physical activity has only been limited by frequent gout flares over the past few months since he had a significant intentional weight loss prior to vacation in December.  He has had a lot of issues with his left elbow with gout flares.  His chest pain felt like a rock was sitting on his chest and was constant until he was given morphine in the emergency department.  Risk factors are obesity and hypertension.  His father passed from a massive heart attack 3 years ago at age 9 and was a long-term smoker.  Ms. Broner has no prior tobacco use history.   Hospital Course  1. Elevated Troponin in the setting of myocarditis: Underwent cardiac cath noted above with normal coronary arteries. Echo with EF of 50-55% with possible lateral and anterior wall motion abnormality. Ddimer was negative to rule out PE. -- Recommended to undergo cardiac MRI which was positive for FOCAL PERICARDITIS ALONG WITH MYOCARDITIS. He was started on  colchicine 0.41m BID along with ibuprofen 6039mTID prior to discharge. Plan for Ibuprofen for one week and colchicine for at least one month. -- repeat echo in one month -- started on protonix prior to dc for one week while taking ibuprofen  2. HTN: was on amlodipine 1069maily, olmesartan 67m68mily and chorthalidone 25mg50mly.  -- will plan to resume amlodipine 10mg 32my, along with olmesartan 67mg d62m. Hold chorthalidone at discharge as he has been hypokalemic prior to admission. -- instructed to monitor his blood pressures and bring to follow up visit.  3. Pre-DM: Hgb A1c 6.2 -- continued follow up as an outpatient with PCP  Did the patient have an acute coronary syndrome (MI, NSTEMI, STEMI, etc) this admission?:  No.   The elevated Troponin was due to the acute medical illness (demand ischemia).   General: Well developed, well nourished, male appearing in no acute distress.  ->  Notes a little burning discomfort in the chest, nothing like he had when he came in. Head: Normocephalic, atraumatic.  Neck: Supple without bruits, JVD. Lungs:  Resp regular and unlabored, CTA. Heart: RRR, S1, S2, no S3, S4, or murmur; no rub. Abdomen: Soft, non-tender, non-distended with normoactive bowel sounds. No hepatomegaly. No rebound/guarding. No obvious abdominal masses. Extremities: No clubbing, cyanosis, edema. Distal pedal pulses are 2+ bilaterally. Right cath site stable without bruising or hematoma Neuro: Alert and oriented X 3. Moves all extremities spontaneously. Psych: Normal affect.     _____________  Discharge Vitals Blood pressure 115/73, pulse (!) 57, temperature (!) 97.5 F (36.4 C), temperature source Oral, resp. rate 20, height _0  (1.778 m), weight (!) 146.3 kg, SpO2 94 %.  Filed Weights   01/25/21 0335 01/26/21 0316 01/27/21 0453  Weight: (!) 147.1 kg (!) 145.9 kg (!) 146.3 kg    Labs & Radiologic Studies    CBC Recent Labs    01/25/21 0324 01/26/21 0324  WBC  8.0 7.8  HGB 14.4 13.4  HCT 45.1 42.2  MCV 86.7 87.7  PLT 239 225   B121c Metabolic Panel Recent Labs    01/26/21 0324 01/27/21 0424  NA 139 139  K 3.8 4.0  CL 100 102  CO2 32 28  GLUCOSE 96 95  BUN 16 20  CREATININE 1.32* 1.20  CALCIUM 9.0 9.0  MG 2.0  --    Liver Function Tests No results for input(s): AST, ALT, ALKPHOS, BILITOT, PROT, ALBUMIN in the last 72 hours. No results for input(s): LIPASE, AMYLASE in the last 72 hours. High Sensitivity Troponin:   Recent Labs  Lab 01/23/21 1557 01/23/21 2024 01/23/21 2214 01/24/21 0952 01/24/21 1354  TROPONINIHS 27* 203* 330* 487* 430*    BNP Invalid input(s): POCBNP D-Dimer Recent Labs    01/26/21 1628  DDIMER <0.27   Hemoglobin A1C Recent Labs    01/25/21 0324  HGBA1C 6.2*   Fasting Lipid Panel Recent Labs    01/25/21 0324  CHOL 171  HDL 45  LDLCALC 96  TRIG 152*  CHOLHDL 3.8   Thyroid Function Tests Recent Labs    01/25/21 0324  TSH 3.344   _____________  DG Chest 2 View  Result Date: 01/23/2021 CLINICAL DATA:  Chest pain EXAM: CHEST - 2 VIEW COMPARISON:  December 14, 2015 FINDINGS: No edema or airspace opacity. Stable eventration of the right hemidiaphragm. Heart size and pulmonary vascularity are normal. No adenopathy. No pneumothorax. No bone lesions. IMPRESSION: Stable eventration of right hemidiaphragm. Lungs clear. Heart size normal. Electronically Signed   By: Lowella Grip III M.D.   On: 01/23/2021 14:16   CARDIAC CATHETERIZATION  Result Date: 01/25/2021 Conclusions: 1. No angiographically significant coronary artery disease. 2. Low normal left ventricular contraction with moderately elevated filling pressure (LVEDP 25-30 mmHg). Recommendations: 1. Consider cardiac MRI to evaluate for evidence of myocarditis/myopericarditis. 2. Gentle diuresis in the setting of elevated LVEDP and low normal LVEF consistent with acute HFpEF. 3. Primary prevention of coronary artery disease. Nelva Bush,  MD [facility]   MR CARDIAC MORPHOLOGY W WO CONTRAST  Result Date: 01/27/2021 CLINICAL DATA:  Clinical question of myocarditis 55 year-old African American Male Study assumes HCT 58 EXAM: CARDIAC MRI TECHNIQUE: The patient was scanned on a 1.5 Tesla GE magnet. A dedicated cardiac coil was used. Functional imaging was done using Fiesta sequences. 2,3, and 4 chamber views were done to assess for RWMA's. Modified Simpson's rule using a short axis stack was used to calculate an ejection fraction on a dedicated work Conservation officer, nature. The patient received 17 cc of Gadavist. After 10 minutes inversion recovery sequences were used to assess for infiltration and scar tissue. CONTRAST:  17 cc  of Gadavist FINDINGS: 1. Normal left ventricular size, with LVEDD 53 mm, and LVEDVi 75 mL/m2. Normal left ventricular thickness, with intraventricular septal thickness of 7 mm, posterior wall thickness of 9 mm, and septal to posterior ratio < 1.5. Normal left ventricular systolic function (LVEF =16%). There are no regional wall motion abnormalities. Left ventricular parametric mapping notable for elevated T2 signal, most prominent in the inferolateral wall (68 msec in the base 61 msec in the mid) with concomitant elevation in native T1 signal (1262 msec and 1168 msec). There is late gadolinium enhancement in the left ventricular myocardium: Mid-myocardial up to 75% of myocardium in the basal inferolateral wall, Mid-myocardial up to 50% of myocardium in the mid inferolateral wall. 2. Normal right ventricular size with RVEDVI 90 mL/m2. Normal right ventricular thickness. Normal right ventricular systolic function (RVEF =96%). There are no regional wall motion abnormalities or aneurysms. 3. Normal left and right atrial size, with LAESV 54 mL and RAESV 52 mL. 4. Normal size of the aortic root, ascending aorta and pulmonary artery. 5.  No significant valvular abnormalities. 6. Increased pericardial thickening: 6 mm. No  pericardial effusion. Gadolinium hyper-enhancement of pericardium of pericardium over-lying the basal inferolateral wall. No qualitative evidence of increase T2 signal. 7. Grossly, no extracardiac findings. Recommended dedicated study if concerned for non-cardiac pathology. 8.  Motion artifact noted. IMPRESSION: 1.  Normal left ventricular function, LVEF 55%. 2. Parametric mapping and gadolinium enhancement pattern consistent with myocarditis; modified [facility] Criteria met. 3. Increase in pericardial thickening and enhancement consistent with pericarditis. 4.  Study consistent with myopericarditis. Rudean Haskell MD Electronically Signed   By: Rudean Haskell MD   On: 01/27/2021 07:14   ECHOCARDIOGRAM COMPLETE  Result Date: 01/24/2021    ECHOCARDIOGRAM REPORT   Patient Name:   Calvin Hancock Date of Exam: 01/24/2021 Medical Rec #:  [id number]       Height:       70.0 in Accession #:    [id number]  Weight:       324.9 lb Date of Birth:  [birth year]       BSA:          2.565 m Patient Age:    50 years        BP:           92/58 mmHg Patient Gender: M               HR:           75 bpm. Exam Location:  Inpatient Procedure: 2D Echo Indications:    NSTEMI  History:        Patient has no prior history of Echocardiogram examinations.                 Risk Factors:Sleep Apnea and Hypertension.  Sonographer:    Johny Chess Referring Phys: [id number] MATTHEW A CARLISLE  Sonographer Comments: Image acquisition challenging due to patient body habitus. IMPRESSIONS  1. Left ventricular ejection fraction, by estimation, is 50 to 55%. The left ventricle has low normal function. Left ventricular endocardial border not optimally defined to evaluate regional wall motion, probable lateral and anterior wall motion abnormality. There is mild left ventricular hypertrophy. Left ventricular diastolic parameters were normal.  2. Right ventricular systolic function is normal. The right ventricular size is normal.  Tricuspid regurgitation signal is inadequate for assessing PA pressure.  3. The mitral valve is normal in structure. No evidence of mitral valve regurgitation. No evidence of mitral stenosis.  4. The aortic valve is normal in structure. Aortic valve regurgitation is not visualized. No aortic stenosis is present.  5. Aortic dilatation noted. There is borderline dilatation of the aortic root, measuring 40 mm.  6. The inferior vena cava is normal in size with greater than 50% respiratory variability, suggesting right atrial pressure of 3 mmHg. FINDINGS  Left Ventricle: Left ventricular ejection fraction, by estimation, is 45 to 50%. The left ventricle has mildly decreased function. Left ventricular endocardial border not optimally defined to evaluate regional wall motion. The left ventricular internal cavity size was normal in size. There is mild left ventricular hypertrophy. Left ventricular diastolic parameters were normal. Right Ventricle: The right ventricular size is normal. No increase in right ventricular wall thickness. Right ventricular systolic function is normal. Tricuspid regurgitation signal is inadequate for assessing PA pressure. Left Atrium: Left atrial size was normal in size. Right Atrium: Right atrial size was normal in size. Pericardium: There is no evidence of pericardial effusion. Mitral Valve: The mitral valve is normal in structure. No evidence of mitral valve regurgitation. No evidence of mitral valve stenosis. Tricuspid Valve: The tricuspid valve is normal in structure. Tricuspid valve regurgitation is not demonstrated. No evidence of tricuspid stenosis. Aortic Valve: The aortic valve is normal in structure. Aortic valve regurgitation is not visualized. No aortic stenosis is present. Pulmonic Valve: The pulmonic valve was grossly normal. Pulmonic valve regurgitation is trivial. No evidence of pulmonic stenosis. Aorta: Aortic dilatation noted. There is borderline dilatation of the aortic root,  measuring 40 mm. Venous: The inferior vena cava is normal in size with greater than 50% respiratory variability, suggesting right atrial pressure of 3 mmHg. IAS/Shunts: No atrial level shunt detected by color flow Doppler.  LEFT VENTRICLE PLAX 2D LVIDd:         5.10 cm  Diastology LVIDs:         3.60 cm  LV e' medial:    12.70 cm/s LV PW:  1.10 cm  LV E/e' medial:  6.2 LV IVS:        1.20 cm  LV e' lateral:   10.40 cm/s LVOT diam:     2.60 cm  LV E/e' lateral: 7.6 LV SV:         105 LV SV Index:   41 LVOT Area:     5.31 cm  RIGHT VENTRICLE RV S prime:     14.00 cm/s TAPSE (M-mode): 2.3 cm LEFT ATRIUM           Index       RIGHT ATRIUM           Index LA diam:      3.30 cm 1.29 cm/m  RA Area:     15.00 cm LA Vol (A4C): 39.7 ml 15.48 ml/m RA Volume:   37.60 ml  14.66 ml/m  AORTIC VALVE LVOT Vmax:   101.00 cm/s LVOT Vmean:  62.800 cm/s LVOT VTI:    0.197 m  AORTA Ao Root diam: 4.00 cm Ao Asc diam:  3.20 cm MITRAL VALVE MV Area (PHT): 3.99 cm    SHUNTS MV Decel Time: 190 msec    Systemic VTI:  0.20 m MV E velocity: 79.30 cm/s  Systemic Diam: 2.60 cm MV A velocity: 68.60 cm/s MV E/A ratio:  1.16 Cherlynn Kaiser MD Electronically signed by Cherlynn Kaiser MD Signature Date/Time: 01/24/2021/2:42:09 PM    Final    Disposition   Pt is being discharged home today in good condition.  Follow-up Plans & Appointments     Follow-up Information    O'Neal, Cassie Freer, MD Follow up on 02/04/2021.   Specialties: Internal Medicine, Cardiology, Radiology Why: at 11am for your follow up appt Contact information: [place] [address] [phone]        Shirline Frees, MD. Call in 1 week(s).   Specialty: Family Medicine Contact information: [address] [place] [address] [phone]              Discharge Instructions    Call MD for:  redness, tenderness, or signs of infection (pain, swelling, redness, odor or green/yellow discharge around incision site)    Complete by: As directed    Diet - low sodium heart healthy   Complete by: As directed    Discharge instructions   Complete by: As directed    Radial Site Care Refer to this sheet in the next few weeks. These instructions provide you with information on caring for yourself after your procedure. Your caregiver may also give you more specific instructions. Your treatment has been planned according to current medical practices, but problems sometimes occur. Call your caregiver if you have any problems or questions after your procedure. HOME CARE INSTRUCTIONS You may shower the day after the procedure.Remove the bandage (dressing) and gently wash the site with plain soap and water.Gently pat the site dry.  Do not apply powder or lotion to the site.  Do not submerge the affected site in water for 3 to 5 days.  Inspect the site at least twice daily.  Do not flex or bend the affected arm for 24 hours.  No lifting over 5 pounds (2.3 kg) for 5 days after your procedure.  Do not drive home if you are discharged the same day of the procedure. Have someone else drive you.  You may drive 24 hours after the procedure unless otherwise instructed by your caregiver.  What to expect: Any bruising will usually  fade within 1 to 2 weeks.  Blood that collects in the tissue (hematoma) may be painful to the touch. It should usually decrease in size and tenderness within 1 to 2 weeks.  SEEK IMMEDIATE MEDICAL CARE IF: You have unusual pain at the radial site.  You have redness, warmth, swelling, or pain at the radial site.  You have drainage (other than a small amount of blood on the dressing).  You have chills.  You have a fever or persistent symptoms for more than 72 hours.  You have a fever and your symptoms suddenly get worse.  Your arm becomes pale, cool, tingly, or numb.  You have heavy bleeding from the site. Hold pressure on the site.   Increase activity slowly   Complete by: As directed        Discharge Medications   Allergies as of 01/27/2021   No Known Allergies     Medication List    STOP taking these medications   ASPIRIN PO   chlorthalidone 25 MG tablet Commonly known as: HYGROTON   indomethacin 25 MG capsule Commonly known as: INDOCIN   potassium chloride SA 20 MEQ tablet Commonly known as: KLOR-CON     TAKE these medications   amLODipine 10 MG tablet Commonly known as: NORVASC Take 10 mg by mouth in the morning.   colchicine 0.6 MG tablet Take 1 tablet (0.6 mg total) by mouth 2 (two) times daily.   ibuprofen 600 MG tablet Commonly known as: ADVIL Take 1 tablet (600 mg total) by mouth 3 (three) times daily.   multivitamin with minerals Tabs tablet Take 1 tablet by mouth in the morning.   olmesartan 40 MG tablet Commonly known as: BENICAR Take 20 mg by mouth every morning.   pantoprazole 40 MG tablet Commonly known as: Protonix Take 1 tablet (40 mg total) by mouth daily for 7 days.        Outstanding Labs/Studies   Echo in one month  Duration of Discharge Encounter   Greater than 30 minutes including physician time.  Signed, Glenetta Hew, MD 01/27/2021, 10:32 PM   ATTENDING ATTESTATION  I have seen, examined and evaluated the patient this PM  along with Reino Bellis, NP-C.  After reviewing all the available data and chart, we discussed the patients laboratory, study & physical findings as well as symptoms in detail. I agree with her findings, examination as well as impression recommendations as per our discussion.     Admitted with chest pain and troponin elevated, but not consistent with an ACS level troponin elevation, however with his ongoing symptoms, was referred for catheterization revealing minimal CAD.  The interesting finding was that the elevated LVEDP.  Echocardiogram was normal.  Therefore cardiac MRI was performed revealing a small focal area of myocarditis  X. Plan: Treat with short course of NSAIDs and  colchicine.  Is ready for discharge.  Follow-up with Dr. Dr. Eleonore Chiquito    Glenetta Hew, M.D., M.S. Interventional Cardiologist   Pager # [phone] Phone # [phone] [address]. [place], [place] [address]

## 2021-02-02 ENCOUNTER — Telehealth: Payer: Self-pay | Admitting: Cardiovascular Disease

## 2021-02-02 NOTE — Telephone Encounter (Signed)
[facility] received paperwork from the [facility] via fax to be completed. Paperwork was given to Dr.O'neal, and nurse on 3/22.

## 2021-02-03 NOTE — Progress Notes (Unsigned)
Cardiology Office Note:   Date:  02/04/2021  NAME:  Calvin Hancock    MRN: [id number] DOB:  May 07, 1966   PCP:  Calvin Frees, MD  Cardiologist:  Calvin Field, MD  Electrophysiologist:  None   Referring MD: Calvin Frees, MD   Chief Complaint  Patient presents with  . Follow-up    History of Present Illness:   Calvin Hancock is a 55 y.o. male with a hx of obesity, HTN, HLD, OSA who was admitted 01/25/2021 for NSTEMI. Found to have myopericarditis.  He reports his symptoms of chest pain have improved.  Still short of breath with exertion.  Also found to have HFpEF during his hospitalization.  Volume status is acceptable.  Weights are stable.  BP 134/86.  He has not returned to work.  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  I informed him that his myocarditis will preclude him from doing this for the next 3 months.  He reports he can feel his heart racing when he exerts himself.  Apparently flat surfaces okay but when he does any inclines or stairs this does bother him.  His symptoms have improved on colchicine.  He is also having trouble with gout flares.  He will see a rheumatologist.  No further chest pain episodes.  He is working on losing weight.  He presents with his wife.  They are working on diet.  I did inform him that he will be limited in terms of activity levels for the next 3 months.  He does understand this.  I also would like to start him on HCTZ for better volume control as well as blood pressure control.  No syncope reported.  Again chest pain symptoms have improved.  Problem List 1. Obesity -BMI 47 2. HTN 3. OSA 4. Myopericarditis 01/25/2021 -LHC normal -CMR with myopericarditis  5. HLD -T chol 171, HDL 45, LDL 96, TG 152 -A1c 6.2 6.  HFpEF  Past Medical History: Past Medical History:  Diagnosis Date  . ED (erectile dysfunction)   . Elevated troponin level not due myocardial infarction, +chest pain 01/23/2021  .  Gout   . Hypertension   . Migraine   . Sleep apnea     Past Surgical History: Past Surgical History:  Procedure Laterality Date  . LEFT HEART CATH AND CORONARY ANGIOGRAPHY N/A 01/25/2021   Procedure: LEFT HEART CATH AND CORONARY ANGIOGRAPHY;  Surgeon: Calvin Bush, MD;  Location: [facility];  Service: Cardiovascular;  Laterality: N/A;  . PATELLA FRACTURE SURGERY      Current Medications: Current Meds  Medication Sig  . amLODipine (NORVASC) 10 MG tablet Take 10 mg by mouth in the morning.  . hydrochlorothiazide (HYDRODIURIL) 25 MG tablet Take 1 tablet (25 mg total) by mouth daily.  Marland Kitchen ibuprofen (ADVIL) 600 MG tablet Take 1 tablet (600 mg total) by mouth 3 (three) times daily.  . Multiple Vitamin (MULTIVITAMIN WITH MINERALS) TABS tablet Take 1 tablet by mouth in the morning.  . olmesartan (BENICAR) 40 MG tablet Take 20 mg by mouth every morning.  . pantoprazole (PROTONIX) 40 MG tablet Take 1 tablet (40 mg total) by mouth daily for 7 days.  . [DISCONTINUED] colchicine 0.6 MG tablet Take 1 tablet (0.6 mg total) by mouth 2 (two) times daily.     Allergies:    Patient has no known allergies.   Social History: Social History   Socioeconomic History  . Marital status:  Single    Spouse name: Not on file  . Number of children: Not on file  . Years of education: Not on file  . Highest education level: Not on file  Occupational History  . Not on file  Tobacco Use  . Smoking status: Never Smoker  . Smokeless tobacco: Never Used  Substance and Sexual Activity  . Alcohol use: Yes    Alcohol/week: 0.0 standard drinks    Comment: social  . Drug use: No  . Sexual activity: Not on file  Other Topics Concern  . Not on file  Social History Narrative  . Not on file   Social Determinants of Health   Financial Resource Strain: Not on file  Food Insecurity: Not on file  Transportation Needs: Not on file  Physical Activity: Not on file  Stress: Not on file  Social  Connections: Not on file     Family History: The patient's family history includes Diabetes in his father; Hypertension in his mother.  ROS:   All other ROS reviewed and negative. Pertinent positives noted in the HPI.     EKGs/Labs/Other Studies Reviewed:   The following studies were personally reviewed by me today:  EKG:  EKG is ordered today.  The ekg ordered today demonstrates sinus bradycardia heart rate 52, right bundle branch block, and was personally reviewed by me.   LHC 01/25/2021 Conclusions: 1. No angiographically significant coronary artery disease. 2. Low normal left ventricular contraction with moderately elevated filling pressure (LVEDP 25-30 mmHg).  Recommendations: 1. Consider cardiac MRI to evaluate for evidence of myocarditis/myopericarditis. 2. Gentle diuresis in the setting of elevated LVEDP and low normal LVEF consistent with acute HFpEF. 3. Primary prevention of coronary artery disease.  CMR 01/27/2021  IMPRESSION: 1.  Normal left ventricular function, LVEF 55%.  2. Parametric mapping and gadolinium enhancement pattern consistent with myocarditis; modified [facility] Criteria met.  3. Increase in pericardial thickening and enhancement consistent with pericarditis.  4.  Study consistent with myopericarditis.  Calvin Haskell MD   Recent Labs: 01/23/2021: ALT 23 01/25/2021: TSH 3.344 01/26/2021: Hemoglobin 13.4; Magnesium 2.0; Platelets 225 01/27/2021: BUN 20; Creatinine, Ser 1.20; Potassium 4.0; Sodium 139   Recent Lipid Panel    Component Value Date/Time   CHOL 171 01/25/2021 0324   TRIG 152 (H) 01/25/2021 0324   HDL 45 01/25/2021 0324   CHOLHDL 3.8 01/25/2021 0324   VLDL 30 01/25/2021 0324   LDLCALC 96 01/25/2021 0324    Physical Exam:   VS:  BP 134/86 (BP Location: Right Arm, Patient Position: Sitting, Cuff Size: Large)   Pulse (!) 55   Ht 5' 10.5" (1.791 m)   Wt (!) 322 lb 3.2 oz (146.1 kg)   SpO2 96%   BMI 45.58 kg/m     Wt Readings from Last 3 Encounters:  02/04/21 (!) 322 lb 3.2 oz (146.1 kg)  01/27/21 (!) 322 lb 8.5 oz (146.3 kg)  12/05/16 (!) 320 lb (145.2 kg)    General: Well nourished, well developed, in no acute distress Head: Atraumatic, normal size  Eyes: PEERLA, EOMI  Neck: Supple, no JVD Endocrine: No thryomegaly Cardiac: Normal S1, S2; RRR; no murmurs, rubs, or gallops Lungs: Clear to auscultation bilaterally, no wheezing, rhonchi or rales  Abd: Soft, nontender, no hepatomegaly  Ext: No edema, pulses 2+ Musculoskeletal: No deformities, BUE and BLE strength normal and equal Skin: Warm and dry, no rashes   Neuro: Alert and oriented to person, place, time, and situation, CNII-XII grossly intact, no  focal deficits  Psych: Normal mood and affect   ASSESSMENT:   KUSH FARABEE is a 55 y.o. male who presents for the following: 1. Palpitations   2. Myopericarditis   3. Primary hypertension   4. Chronic diastolic heart failure ([place])   5. Mixed hyperlipidemia   6. Obesity, morbid, BMI 40.0-49.9 ([place])     PLAN:   1. Palpitations 2. Myopericarditis -Recent admission for non-STEMI.  Left heart cath normal.  Found to have myopericarditis.  He will continue colchicine 0.6 mg twice daily for 3 months.  I would like to check a sed rate and CRP.  He also is reporting palpitations.  I would like to check a 7-day Zio patch to exclude any arrhythmias.  His EKG in office demonstrates sinus bradycardia with right bundle branch block.  There are no acute ischemic changes. -He was advised to avoid any strenuous activity.  He cannot do any running.  He can do light walking.  He cannot lift any more than 10 to 15 pounds.  Apparently he does a lot of heavy lifting at work.  This appears to not be feasible for him right now.  I have recommended he go on short-term disability unless he can do light duty.  He will look into this. -His echo showed normal LV function.  As long as he has no arrhythmias, we will likely  let him go back to normal activity in 3 months. -Unclear what his myopericarditis came from.  He had the Covid booster in September 2021.  No recent viral illness.  This appears to be idiopathic.  3. Primary hypertension 4. Chronic diastolic heart failure (H[facility]) -Elevated LVEDP at the time of left heart cath.  Normal coronary arteries.  I will add HCTZ for better blood pressure control.  This will also serve as a diuretic for him.  He denies any significant symptoms of lower extremity edema.  He does get short of breath with exertion.  Unclear if this is myocarditis related versus HFpEF.  He appears euvolemic.  I recommended reduction in salty foods and light exercise.  He needs to lose weight as well.  This will help.  5. Mixed hyperlipidemia -Most recent LDL cholesterol 96.  Normal coronary arteries.  Acceptable for now.  6. 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  Disposition: Return in about 6 weeks (around 03/18/2021).  Medication Adjustments/Labs and Tests Ordered: Current medicines are reviewed at length with the patient today.  Concerns regarding medicines are outlined above.  Orders Placed This Encounter  Procedures  . Sedimentation rate  . C-reactive protein  . LONG TERM MONITOR (3-14 DAYS)  . EKG 12-Lead   Meds ordered this encounter  Medications  . hydrochlorothiazide (HYDRODIURIL) 25 MG tablet    Sig: Take 1 tablet (25 mg total) by mouth daily.    Dispense:  90 tablet    Refill:  3  . colchicine 0.6 MG tablet    Sig: Take 1 tablet (0.6 mg total) by mouth 2 (two) times daily.    Dispense:  180 tablet    Refill:  0    Patient Instructions  Medication Instructions:  Start HCTZ 25 mg daily   *If you need a refill on your cardiac medications before your next appointment, please call your pharmacy*   Lab Work: ESR, CRP today   If you have labs (blood work) drawn today and your tests are completely normal, you will receive your results only  by: Marland Kitchen MyChart Message (if  you have MyChart) OR . A paper copy in the mail If you have any lab test that is abnormal or we need to change your treatment, we will call you to review the results.   Testing/Procedures: Bryn Gulling- Long Term Monitor Instructions   Your physician has requested you wear your ZIO patch monitor___7____days.   This is a single patch monitor.  [employer] supplies one patch monitor per enrollment.  Additional stickers are not available.   Please do not apply patch if you will be having a Nuclear Stress Test, Echocardiogram, Cardiac CT, MRI, or Chest Xray during the time frame you would be wearing the monitor. The patch cannot be worn during these tests.  You cannot remove and re-apply the ZIO XT patch monitor.   Your ZIO patch monitor will be sent USPS Priority mail from [facility] directly to your home address. The monitor may also be mailed to a PO BOX if home delivery is not available.   It may take 3-5 days to receive your monitor after you have been enrolled.   Once you have received you monitor, please review enclosed instructions.  Your monitor has already been registered assigning a specific monitor serial # to you.   Applying the monitor   Shave hair from upper left chest.   Hold abrader disc by orange tab.  Rub abrader in 40 strokes over left upper chest as indicated in your monitor instructions.   Clean area with 4 enclosed alcohol pads .  Use all pads to assure are is cleaned thoroughly.  Let dry.   Apply patch as indicated in monitor instructions.  Patch will be place under collarbone on left side of chest with arrow pointing upward.   Rub patch adhesive wings for 2 minutes.Remove white label marked "1".  Remove white label marked "2".  Rub patch adhesive wings for 2 additional minutes.   While looking in a mirror, press and release button in center of patch.  A small green light will flash 3-4 times .  This will be your only indicator the monitor  has been turned on.     Do not shower for the first 24 hours.  You may shower after the first 24 hours.   Press button if you feel a symptom. You will hear a small click.  Record Date, Time and Symptom in the Patient Log Book.   When you are ready to remove patch, follow instructions on last 2 pages of Patient Log Book.  Stick patch monitor onto last page of Patient Log Book.   Place Patient Log Book in [place] box.  Use locking tab on box and tape box closed securely.  The [employer] and [employer] has [employer] on it.  Please place in mailbox as soon as possible.  Your physician should have your test results approximately 7 days after the monitor has been mailed back to [facility].   Call [place] at [phone] if you have questions regarding your ZIO XT patch monitor.  Call them immediately if you see an orange light blinking on your monitor.   If your monitor falls off in less than 4 days contact our Monitor department at [phone].  If your monitor becomes loose or falls off after 4 days call [employer] at [phone] for suggestions on securing your monitor.     Follow-Up: At [facility], you and your health needs are our priority.  As part of our continuing mission to provide you with exceptional heart  care, we have created designated Provider Care Teams.  These Care Teams include your primary Cardiologist (physician) and Advanced Practice Providers (APPs -  Physician Assistants and Nurse Practitioners) who all work together to provide you with the care you need, when you need it.  We recommend signing up for the patient portal called "MyChart".  Sign up information is provided on this After Visit Summary.  MyChart is used to connect with patients for Virtual Visits (Telemedicine).  Patients are able to view lab/test results, encounter notes, upcoming appointments, etc.  Non-urgent messages can be sent to your provider as well.   To learn more about what you  can do with MyChart, go to [email or web address].    Your next appointment:   6 week(s)  The format for your next appointment:   In Person  Provider:   Eleonore Chiquito, MD       Time Spent with Patient: I have spent a total of 35 minutes with patient reviewing hospital notes, telemetry, EKGs, labs and examining the patient as well as establishing an assessment and plan that was discussed with the patient.  > 50% of time was spent in direct patient care.  Signed, Addison Naegeli. Audie Box, MD, [place]  [address], [place], [place] [address] [phone]  02/04/2021 12:46 PM

## 2021-02-04 ENCOUNTER — Encounter: Payer: Self-pay | Admitting: *Deleted

## 2021-02-04 ENCOUNTER — Encounter: Payer: Self-pay | Admitting: Cardiovascular Disease

## 2021-02-04 ENCOUNTER — Ambulatory Visit: Payer: Managed Care, Other (non HMO) | Admitting: Cardiovascular Disease

## 2021-02-04 ENCOUNTER — Ambulatory Visit (INDEPENDENT_AMBULATORY_CARE_PROVIDER_SITE_OTHER): Payer: Managed Care, Other (non HMO)

## 2021-02-04 ENCOUNTER — Other Ambulatory Visit: Payer: Self-pay

## 2021-02-04 VITALS — BP 134/86 | HR 55 | Ht 70.5 in | Wt 322.2 lb

## 2021-02-04 DIAGNOSIS — I10 Essential (primary) hypertension: Secondary | ICD-10-CM | POA: Diagnosis not present

## 2021-02-04 DIAGNOSIS — E782 Mixed hyperlipidemia: Secondary | ICD-10-CM

## 2021-02-04 DIAGNOSIS — I319 Disease of pericardium, unspecified: Secondary | ICD-10-CM

## 2021-02-04 DIAGNOSIS — Z0279 Encounter for issue of other medical certificate: Secondary | ICD-10-CM

## 2021-02-04 DIAGNOSIS — R002 Palpitations: Secondary | ICD-10-CM

## 2021-02-04 DIAGNOSIS — I5032 Chronic diastolic (congestive) heart failure: Secondary | ICD-10-CM | POA: Diagnosis not present

## 2021-02-04 MED ORDER — HYDROCHLOROTHIAZIDE 25 MG PO TABS: 25.0000 mg | ORAL_TABLET | Freq: Every day | ORAL | 3 refills | Status: DC

## 2021-02-04 MED ORDER — COLCHICINE 0.6 MG PO TABS: 0.6000 mg | ORAL_TABLET | Freq: Two times a day (BID) | ORAL | 0 refills | Status: DC

## 2021-02-04 NOTE — Progress Notes (Signed)
Patient ID: Calvin Hancock, male   DOB: 1966-07-11, 55 y.o.   MRN: [id number] Patient enrolled for [employer] to ship a 7 day ZIO XT long term holter monitor to his home.

## 2021-02-04 NOTE — Patient Instructions (Signed)
Medication Instructions:  Start HCTZ 25 mg daily   *If you need a refill on your cardiac medications before your next appointment, please call your pharmacy*   Lab Work: ESR, CRP today   If you have labs (blood work) drawn today and your tests are completely normal, you will receive your results only by: Marland Kitchen MyChart Message (if you have MyChart) OR . A paper copy in the mail If you have any lab test that is abnormal or we need to change your treatment, we will call you to review the results.   Testing/Procedures: Bryn Gulling- Long Term Monitor Instructions   Your physician has requested you wear your ZIO patch monitor___7____days.   This is a single patch monitor.  [employer] supplies one patch monitor per enrollment.  Additional stickers are not available.   Please do not apply patch if you will be having a Nuclear Stress Test, Echocardiogram, Cardiac CT, MRI, or Chest Xray during the time frame you would be wearing the monitor. The patch cannot be worn during these tests.  You cannot remove and re-apply the ZIO XT patch monitor.   Your ZIO patch monitor will be sent USPS Priority mail from [facility] directly to your home address. The monitor may also be mailed to a PO BOX if home delivery is not available.   It may take 3-5 days to receive your monitor after you have been enrolled.   Once you have received you monitor, please review enclosed instructions.  Your monitor has already been registered assigning a specific monitor serial # to you.   Applying the monitor   Shave hair from upper left chest.   Hold abrader disc by orange tab.  Rub abrader in 40 strokes over left upper chest as indicated in your monitor instructions.   Clean area with 4 enclosed alcohol pads .  Use all pads to assure are is cleaned thoroughly.  Let dry.   Apply patch as indicated in monitor instructions.  Patch will be place under collarbone on left side of chest with arrow pointing upward.   Rub patch  adhesive wings for 2 minutes.Remove white label marked "1".  Remove white label marked "2".  Rub patch adhesive wings for 2 additional minutes.   While looking in a mirror, press and release button in center of patch.  A small green light will flash 3-4 times .  This will be your only indicator the monitor has been turned on.     Do not shower for the first 24 hours.  You may shower after the first 24 hours.   Press button if you feel a symptom. You will hear a small click.  Record Date, Time and Symptom in the Patient Log Book.   When you are ready to remove patch, follow instructions on last 2 pages of Patient Log Book.  Stick patch monitor onto last page of Patient Log Book.   Place Patient Log Book in [place] box.  Use locking tab on box and tape box closed securely.  The [employer] has [employer] on it.  Please place in mailbox as soon as possible.  Your physician should have your test results approximately 7 days after the monitor has been mailed back to [facility].   Call [place] at [phone] if you have questions regarding your ZIO XT patch monitor.  Call them immediately if you see an orange light blinking on your monitor.   If your monitor falls off in  less than 4 days contact our Monitor department at [phone].  If your monitor becomes loose or falls off after 4 days call [employer] at [phone] for suggestions on securing your monitor.     Follow-Up: At [facility], you and your health needs are our priority.  As part of our continuing mission to provide you with exceptional heart care, we have created designated Provider Care Teams.  These Care Teams include your primary Cardiologist (physician) and Advanced Practice Providers (APPs -  Physician Assistants and Nurse Practitioners) who all work together to provide you with the care you need, when you need it.  We recommend signing up for the patient portal called "MyChart".  Sign up  information is provided on this After Visit Summary.  MyChart is used to connect with patients for Virtual Visits (Telemedicine).  Patients are able to view lab/test results, encounter notes, upcoming appointments, etc.  Non-urgent messages can be sent to your provider as well.   To learn more about what you can do with MyChart, go to [email or web address].    Your next appointment:   6 week(s)  The format for your next appointment:   In Person  Provider:   Eleonore Chiquito, MD

## 2021-02-05 LAB — C-REACTIVE PROTEIN: CRP: 2 mg/L (ref 0–10)

## 2021-02-05 LAB — SEDIMENTATION RATE: Sed Rate: 3 mm/hr (ref 0–30)

## 2021-02-06 ENCOUNTER — Encounter (HOSPITAL_COMMUNITY): Payer: Self-pay

## 2021-02-06 ENCOUNTER — Emergency Department (HOSPITAL_COMMUNITY): Payer: Managed Care, Other (non HMO)

## 2021-02-06 ENCOUNTER — Other Ambulatory Visit: Payer: Self-pay

## 2021-02-06 ENCOUNTER — Emergency Department (HOSPITAL_COMMUNITY)
Admission: EM | Admit: 2021-02-06 | Discharge: 2021-02-07 | Disposition: A | Discharge status: Home / Self Care | Payer: Managed Care, Other (non HMO) | Attending: Emergency Medicine | Admitting: Emergency Medicine

## 2021-02-06 DIAGNOSIS — I10 Essential (primary) hypertension: Secondary | ICD-10-CM | POA: Insufficient documentation

## 2021-02-06 DIAGNOSIS — R072 Precordial pain: Secondary | ICD-10-CM | POA: Insufficient documentation

## 2021-02-06 HISTORY — DX: Myocarditis, unspecified: I51.4

## 2021-02-06 LAB — BASIC METABOLIC PANEL
Anion gap: 8 (ref 5–15)
BUN: 19 mg/dL (ref 6–20)
CO2: 29 mmol/L (ref 22–32)
Calcium: 9.4 mg/dL (ref 8.9–10.3)
Chloride: 99 mmol/L (ref 98–111)
Creatinine, Ser: 1.3 mg/dL — ABNORMAL HIGH (ref 0.61–1.24)
GFR, Estimated: >60 mL/min (ref >60)
Glucose, Bld: 84 mg/dL (ref 70–99)
Potassium: 3.5 mmol/L (ref 3.5–5.1)
Sodium: 136 mmol/L (ref 135–145)

## 2021-02-06 LAB — CBC
HCT: 48.3 % (ref 39.0–52.0)
Hemoglobin: 15.7 g/dL (ref 13.0–17.0)
MCH: 28.2 pg (ref 26.0–34.0)
MCHC: 32.5 g/dL (ref 30.0–36.0)
MCV: 86.7 fL (ref 80.0–100.0)
Platelets: 237 10*3/uL (ref 150–400)
RBC: 5.57 MIL/uL (ref 4.22–5.81)
RDW: 14.6 % (ref 11.5–15.5)
WBC: 6 10*3/uL (ref 4.0–10.5)
nRBC: 0 % (ref 0.0–0.2)

## 2021-02-06 LAB — TROPONIN I (HIGH SENSITIVITY): Troponin I (High Sensitivity): 10 ng/L (ref <18)

## 2021-02-06 NOTE — ED Provider Notes (Signed)
Emergency Department Provider Note   I have reviewed the triage vital signs and the nursing notes.   HISTORY  Chief Complaint Chest Pain   HPI Calvin Hancock is a 55 y.o. male with past medical history reviewed below including recent admission to the cardiology service with chest pain ultimately found to be due to myocarditis.  Patient has been feeling well since discharge and taking his colchicine.  He developed return of chest pain at 8 PM this evening and return to the emergency department for evaluation.  The pain is in his central chest and feels like a pressure/tightness without radiation of symptoms.  Minimal shortness of breath symptoms and mainly with exertion.  No leg swelling.  No fevers.  He states the symptoms are more mild than what he was having earlier this month which prompted admission and work-up for possible NSTEMI with elevated troponins at that time.  He had a left heart cath which showed no significant narrowing and cardiac MRI consistent with myopericarditis.   Past Medical History:  Diagnosis Date  . ED (erectile dysfunction)   . Elevated troponin level not due myocardial infarction, +chest pain 01/23/2021  . Gout   . Hypertension   . Migraine   . Myocarditis (HCC)   . Sleep apnea     Patient Active Problem List   Diagnosis Date Noted  . Myopericarditis 01/27/2021  . HTN (hypertension) 01/24/2021  . OSA (obstructive sleep apnea) 01/24/2021  . Elevated troponin level not due myocardial infarction, +chest pain 01/23/2021  . Severe obesity (BMI >= 40) (HCC) 12/15/2015  . Dyspnea 12/14/2015    Past Surgical History:  Procedure Laterality Date  . LEFT HEART CATH AND CORONARY ANGIOGRAPHY N/A 01/25/2021   Procedure: LEFT HEART CATH AND CORONARY ANGIOGRAPHY;  Surgeon: [name], MD;  Location: [facility];  Service: Cardiovascular;  Laterality: N/A;  . PATELLA FRACTURE SURGERY      Allergies Patient has no known allergies.  Family  History  Problem Relation Age of Onset  . Hypertension Mother   . Diabetes Father     Social History Social History   Tobacco Use  . Smoking status: Never Smoker  . Smokeless tobacco: Never Used  Substance Use Topics  . Alcohol use: Yes    Alcohol/week: 0.0 standard drinks    Comment: social  . Drug use: No    Review of Systems  Constitutional: No fever/chills Eyes: No visual changes. ENT: No sore throat. Cardiovascular: Positive chest pain. Respiratory: Denies shortness of breath. Gastrointestinal: No abdominal pain.  No nausea, no vomiting.  No diarrhea.  No constipation. Genitourinary: Negative for dysuria. Musculoskeletal: Negative for back pain. Skin: Negative for rash. Neurological: Negative for headaches, focal weakness or numbness.  10-point ROS otherwise negative.  ____________________________________________   PHYSICAL EXAM:  VITAL SIGNS: ED Triage Vitals  Enc Vitals Group     BP 02/06/21 2121 139/77     Pulse Rate 02/06/21 2121 60     Resp 02/06/21 2121 20     Temp 02/06/21 2121 98 F (36.7 C)     Temp Source 02/06/21 2121 Oral     SpO2 02/06/21 2121 94 %     Weight 02/06/21 2134 (!) 321 lb (145.6 kg)     Height 02/06/21 2134 5\' 10"  (1.778 m)   Constitutional: Alert and oriented. Well appearing and in no acute distress. Eyes: Conjunctivae are normal.  Head: Atraumatic. Nose: No congestion/rhinnorhea. Mouth/Throat: Mucous membranes are moist. Neck: No stridor.  Cardiovascular: Normal rate, regular rhythm. Good peripheral circulation. Grossly normal heart sounds.   Respiratory: Normal respiratory effort.  No retractions. Lungs CTAB. Gastrointestinal: Soft and nontender. No distention.  Musculoskeletal: No lower extremity tenderness nor edema. No gross deformities of extremities. Neurologic:  Normal speech and language.  Skin:  Skin is warm, dry and intact. No rash noted.  ____________________________________________   LABS (all labs  ordered are listed, but only abnormal results are displayed)  Labs Reviewed  BASIC METABOLIC PANEL - Abnormal; Notable for the following components:      Result Value   Creatinine, Ser 1.30 (*)    All other components within normal limits  CBC  TROPONIN I (HIGH SENSITIVITY)  TROPONIN I (HIGH SENSITIVITY)   ____________________________________________  EKG   EKG Interpretation  Date/Time:  Saturday February 06 2021 21:27:53 EDT Ventricular Rate:  61 PR Interval:  150 QRS Duration: 106 QT Interval:  434 QTC Calculation: 436 R Axis:   101 Text Interpretation: Normal sinus rhythm Possible Right ventricular hypertrophy Cannot rule out Inferior infarct , age undetermined Abnormal ECG Confirmed by Alona Bene [phone]) on 02/06/2021 11:00:43 PM       ____________________________________________  RADIOLOGY  DG Chest 2 View  Result Date: 02/06/2021 CLINICAL DATA:  Chest pain recent diagnosis of myocarditis EXAM: CHEST - 2 VIEW COMPARISON:  None. FINDINGS: The heart size and mediastinal contours are within normal limits. Both lungs are clear. Stable eventration of the right hemidiaphragm. The visualized skeletal structures are unremarkable. IMPRESSION: No active cardiopulmonary disease. Electronically Signed   By: Maudry Mayhew MD   On: 02/06/2021 22:08    ____________________________________________   PROCEDURES  Procedure(s) performed:   Procedures  None  ____________________________________________   INITIAL IMPRESSION / ASSESSMENT AND PLAN / ED COURSE  Pertinent labs & imaging results that were available during my care of the patient were reviewed by me and considered in my medical decision making (see chart for details).   Patient presents to the emergency department for evaluation of chest pain.  His initial troponin is 10.  Lab work shows no significant abnormality in comparison to his prior studies.  Chest x-ray is clear. Reviewed prior discharge summary and heart  cath/cardiac MRI from prior.   Labs and imaging reviewed. Troponin negative x 2. Plan for continued symptom mgmt at home and close Cardiology follow up.  ____________________________________________  FINAL CLINICAL IMPRESSION(S) / ED DIAGNOSES  Final diagnoses:  Precordial chest pain     Note:  This document was prepared using Dragon voice recognition software and may include unintentional dictation errors.  Alona Bene, MD, [facility] Emergency Medicine    Burwell Bethel, Arlyss Repress, MD 02/09/21 1017

## 2021-02-06 NOTE — ED Triage Notes (Addendum)
Pt reports that he is having chest pain  In the center of chest that is described as " like a rock sitting on chest. Took 2 baby aspirin PTA. Denies nausea/vomiting or SOB. Started 1 hour ago. States he was admitted 2 weeks ago for myocarditis.

## 2021-02-07 LAB — TROPONIN I (HIGH SENSITIVITY): Troponin I (High Sensitivity): 8 ng/L (ref <18)

## 2021-02-07 NOTE — Discharge Instructions (Signed)
You were seen in the ED today with chest pain. Your heart enzymes are normal today. Please follow with your PCP and Cardiology teams in the coming week. Return to the ED with any new or worsening symptoms.

## 2021-02-08 DIAGNOSIS — R002 Palpitations: Secondary | ICD-10-CM

## 2021-02-16 NOTE — Telephone Encounter (Signed)
Paperwork received back from provider, paperwork was faxed on 4/5.

## 2021-02-23 NOTE — Telephone Encounter (Signed)
Paperwork was refaxed today.

## 2021-03-16 NOTE — Progress Notes (Signed)
Cardiology Office Note:   Date:  03/17/2021  NAME:  Calvin Hancock    MRN: [id number] DOB:  06/25/1966   PCP:  Shirline Frees, MD  Cardiologist:  Evalina Field, MD   Referring MD: Shirline Frees, MD   Chief Complaint  Patient presents with  . Follow-up  . Headache  . Shortness of Breath  . Chest Pain   History of Present Illness:   KEY CEN is a 55 y.o. male with a hx of obesity, HTN, HLD, HFpEF, myocarditis who presents for follow-up. Diagnosed with myocarditis 01/25/2021.  His blood pressure is much improved 116/80.  He still is getting short of breath and having chest pressure with exertion.  Symptoms occur strictly with exertion.  No heart racing spells.  No passing out.  He is out of work.  His job requires heavy lifting.  I recommended he remain out of full activities until 3 months.  From his diagnosis of myocarditis.  Unfortunately cardiac rehab is not an option right now.  Light activity is okay but I want him to avoid any strenuous or heavy activity.  No running.  Light walking is fine.  He is working on his diet.  His wife has a good plan.  All of his inflammatory markers have normalized.  He seems to be doing well still having some residual symptoms.  Monitor with 1 run of nonsustained ventricular tachycardia.  Overall he reports he is improving it is just taking time.  Problem List 1. Obesity -BMI 47 2. HTN 3. OSA 4. Myopericarditis 01/25/2021 -LHC normal -CMR with myopericarditis  5. HLD -T chol 171, HDL 45, LDL 96, TG 152 -A1c 6.2 6.  HFpEF  Past Medical History: Past Medical History:  Diagnosis Date  . ED (erectile dysfunction)   . Elevated troponin level not due myocardial infarction, +chest pain 01/23/2021  . Gout   . Hypertension   . Migraine   . Myocarditis ([place])   . Sleep apnea     Past Surgical History: Past Surgical History:  Procedure Laterality Date  . LEFT HEART CATH AND CORONARY ANGIOGRAPHY N/A 01/25/2021   Procedure: LEFT HEART CATH  AND CORONARY ANGIOGRAPHY;  Surgeon: Nelva Bush, MD;  Location: [place] [facility];  Service: Cardiovascular;  Laterality: N/A;  . PATELLA FRACTURE SURGERY      Current Medications: Current Meds  Medication Sig  . allopurinol (ZYLOPRIM) 100 MG tablet Take 100 mg by mouth daily.  Marland Kitchen amLODipine (NORVASC) 10 MG tablet Take 10 mg by mouth in the morning.  . colchicine 0.6 MG tablet TAKE 1 TABLET (0.6 MG TOTAL) BY MOUTH 2 (TWO) TIMES DAILY.  . hydrochlorothiazide (HYDRODIURIL) 25 MG tablet Take 1 tablet (25 mg total) by mouth daily.  . Multiple Vitamin (MULTIVITAMIN WITH MINERALS) TABS tablet Take 1 tablet by mouth in the morning.  . olmesartan (BENICAR) 40 MG tablet Take 20 mg by mouth every morning.  . pantoprazole (PROTONIX) 40 MG tablet TAKE 1 TABLET (40 MG TOTAL) BY MOUTH DAILY AT 12 NOON. (Patient taking differently: Take 40 mg by mouth daily as needed.)  . [DISCONTINUED] colchicine 0.6 MG tablet Take 1 tablet (0.6 mg total) by mouth 2 (two) times daily.  . [DISCONTINUED] ibuprofen (ADVIL) 600 MG tablet Take 1 tablet (600 mg total) by mouth 3 (three) times daily.  . [DISCONTINUED] ibuprofen (ADVIL) 600 MG tablet TAKE 1 TABLET (600 MG TOTAL) BY MOUTH 3 (THREE) TIMES DAILY.  . [DISCONTINUED] ibuprofen (ADVIL) 600 MG tablet TAKE 1  TABLET (600 MG TOTAL) BY MOUTH 3 (THREE) TIMES DAILY.  . [DISCONTINUED] pantoprazole (PROTONIX) 40 MG tablet TAKE 1 TABLET (40 MG TOTAL) BY MOUTH DAILY FOR 7 DAYS.     Allergies:    Patient has no known allergies.   Social History: Social History   Socioeconomic History  . Marital status: Single    Spouse name: Not on file  . Number of children: Not on file  . Years of education: Not on file  . Highest education level: Not on file  Occupational History  . Not on file  Tobacco Use  . Smoking status: Never Smoker  . Smokeless tobacco: Never Used  Substance and Sexual Activity  . Alcohol use: Yes    Alcohol/week: 0.0 standard drinks    Comment:  social  . Drug use: No  . Sexual activity: Not on file  Other Topics Concern  . Not on file  Social History Narrative  . Not on file   Social Determinants of Health   Financial Resource Strain: Not on file  Food Insecurity: Not on file  Transportation Needs: Not on file  Physical Activity: Not on file  Stress: Not on file  Social Connections: Not on file     Family History: The patient's family history includes Diabetes in his father; Hypertension in his mother.  ROS:   All other ROS reviewed and negative. Pertinent positives noted in the HPI.     EKGs/Labs/Other Studies Reviewed:   The following studies were personally reviewed by me today:  TTE 01/24/2021 1. Left ventricular ejection fraction, by estimation, is 50 to 55%. The  left ventricle has low normal function. Left ventricular endocardial  border not optimally defined to evaluate regional wall motion, probable  lateral and anterior wall motion  abnormality. There is mild left ventricular hypertrophy. Left ventricular  diastolic parameters were normal.  2. Right ventricular systolic function is normal. The right ventricular  size is normal. Tricuspid regurgitation signal is inadequate for assessing  PA pressure.  3. The mitral valve is normal in structure. No evidence of mitral valve  regurgitation. No evidence of mitral stenosis.  4. The aortic valve is normal in structure. Aortic valve regurgitation is  not visualized. No aortic stenosis is present.  5. Aortic dilatation noted. There is borderline dilatation of the aortic  root, measuring 40 mm.  6. The inferior vena cava is normal in size with greater than 50%  respiratory variability, suggesting right atrial pressure of 3 mmHg.   Zio 02/27/2021  Impression: 1. Brief non-sustained ventricular tachycardia detected (1 episode; 8.7 second duration).  2. Occasional PVCs (1.3% burden).    Recent Labs: 01/23/2021: ALT 23 01/25/2021: TSH 3.344 01/26/2021:  Magnesium 2.0 02/06/2021: BUN 19; Creatinine, Ser 1.30; Hemoglobin 15.7; Platelets 237; Potassium 3.5; Sodium 136   Recent Lipid Panel    Component Value Date/Time   CHOL 171 01/25/2021 0324   TRIG 152 (H) 01/25/2021 0324   HDL 45 01/25/2021 0324   CHOLHDL 3.8 01/25/2021 0324   VLDL 30 01/25/2021 0324   LDLCALC 96 01/25/2021 0324    Physical Exam:   VS:  BP 116/80 (BP Location: Left Arm, Patient Position: Sitting, Cuff Size: Large)   Pulse 78   Ht 5' 10"  (1.778 m)   Wt (!) 319 lb (144.7 kg)   BMI 45.77 kg/m    Wt Readings from Last 3 Encounters:  03/17/21 (!) 319 lb (144.7 kg)  02/06/21 (!) 321 lb (145.6 kg)  02/04/21 (!) 322 lb  3.2 oz (146.1 kg)    General: Well nourished, well developed, in no acute distress Head: Atraumatic, normal size  Eyes: PEERLA, EOMI  Neck: Supple, no JVD Endocrine: No thryomegaly Cardiac: Normal S1, S2; RRR; no murmurs, rubs, or gallops Lungs: Clear to auscultation bilaterally, no wheezing, rhonchi or rales  Abd: Soft, nontender, no hepatomegaly  Ext: No edema, pulses 2+ Musculoskeletal: No deformities, BUE and BLE strength normal and equal Skin: Warm and dry, no rashes   Neuro: Alert and oriented to person, place, time, and situation, CNII-XII grossly intact, no focal deficits  Psych: Normal mood and affect   ASSESSMENT:   DEMONTREZ RINDFLEISCH is a 55 y.o. male who presents for the following: 1. Myopericarditis   2. Primary hypertension   3. Obesity, morbid, BMI 40.0-49.9 ([place])   4. Mixed hyperlipidemia   5. Chronic diastolic heart failure (HCC)     PLAN:   1. Myopericarditis -Diagnosed with myopericarditis on 01/25/2021.  Left heart catheterization normal.  CRP and ESR were negative.  MRI did confirm his diagnosis of myocarditis.  -Suspect undetermined viral etiology.  Not related to COVID or COVID-vaccine. -He has normal LV function.  Recent monitor with brief nonsustained ventricular tachycardia. -Still having symptoms of shortness of  breath and chest pain. -For now I have recommended he continue with light activity.  He cannot go back to work until 3 months from his diagnosis which will be 05/03/2021.  Myocarditis cannot be treated with cardiac rehabilitation.  He does not need to exert himself forcefully until he is 3 months from his diagnosis.  We likely will need to repeat a monitor to make sure he has no ventricular ectopy.  I would also like for his symptoms resolved before we send him back to work.  We will continue to see him back in 6 weeks to determine this.  For now he will be out of work. -No need for anti-inflammatory medications other than colchicine this time.  Things are slowly improving.  2. Primary hypertension -BP well controlled.  Given information on salt reduction.  3. Obesity, morbid, BMI 40.0-49.9 (HCC) -Weight loss is recommended.  He can work on his diet since he cannot exercise vigorously.  4. Mixed hyperlipidemia -Normal coronaries.  Nondiabetic.  Continue to monitor for now.  5. Chronic diastolic heart failure ([place]) -Euvolemic on examination.  Continue with aggressive blood pressure control.  Disposition: Return in about 6 weeks (around 04/28/2021).  Medication Adjustments/Labs and Tests Ordered: Current medicines are reviewed at length with the patient today.  Concerns regarding medicines are outlined above.  No orders of the defined types were placed in this encounter.  No orders of the defined types were placed in this encounter.   Patient Instructions  Medication Instructions:  The current medical regimen is effective;  continue present plan and medications.  *If you need a refill on your cardiac medications before your next appointment, please call your pharmacy*   Follow-Up: At [facility], you and your health needs are our priority.  As part of our continuing mission to provide you with exceptional heart care, we have created designated Provider Care Teams.  These Care Teams  include your primary Cardiologist (physician) and Advanced Practice Providers (APPs -  Physician Assistants and Nurse Practitioners) who all work together to provide you with the care you need, when you need it.  We recommend signing up for the patient portal called "MyChart".  Sign up information is provided on this After Visit Summary.  MyChart  is used to connect with patients for Virtual Visits (Telemedicine).  Patients are able to view lab/test results, encounter notes, upcoming appointments, etc.  Non-urgent messages can be sent to your provider as well.   To learn more about what you can do with MyChart, go to [email or web address].    Your next appointment:   6-7 week(s)  The format for your next appointment:   In Person  Provider:   Eleonore Chiquito, MD       Time Spent with Patient: I have spent a total of 25 minutes with patient reviewing hospital notes, telemetry, EKGs, labs and examining the patient as well as establishing an assessment and plan that was discussed with the patient.  > 50% of time was spent in direct patient care.  Signed, Addison Naegeli. Audie Box, MD, [place]  [address] [place], [place] [address] [phone]  03/17/2021 8:38 AM

## 2021-03-17 ENCOUNTER — Other Ambulatory Visit: Payer: Self-pay

## 2021-03-17 ENCOUNTER — Encounter: Payer: Self-pay | Admitting: Cardiovascular Disease

## 2021-03-17 ENCOUNTER — Ambulatory Visit (INDEPENDENT_AMBULATORY_CARE_PROVIDER_SITE_OTHER): Payer: Managed Care, Other (non HMO) | Admitting: Cardiovascular Disease

## 2021-03-17 VITALS — BP 116/80 | HR 78 | Ht 70.0 in | Wt 319.0 lb

## 2021-03-17 DIAGNOSIS — E782 Mixed hyperlipidemia: Secondary | ICD-10-CM | POA: Diagnosis not present

## 2021-03-17 DIAGNOSIS — I5032 Chronic diastolic (congestive) heart failure: Secondary | ICD-10-CM

## 2021-03-17 DIAGNOSIS — I319 Disease of pericardium, unspecified: Secondary | ICD-10-CM | POA: Diagnosis not present

## 2021-03-17 DIAGNOSIS — I10 Essential (primary) hypertension: Secondary | ICD-10-CM

## 2021-03-17 NOTE — Patient Instructions (Signed)
Medication Instructions:  The current medical regimen is effective;  continue present plan and medications.  *If you need a refill on your cardiac medications before your next appointment, please call your pharmacy*   Follow-Up: At [facility], you and your health needs are our priority.  As part of our continuing mission to provide you with exceptional heart care, we have created designated Provider Care Teams.  These Care Teams include your primary Cardiologist (physician) and Advanced Practice Providers (APPs -  Physician Assistants and Nurse Practitioners) who all work together to provide you with the care you need, when you need it.  We recommend signing up for the patient portal called "MyChart".  Sign up information is provided on this After Visit Summary.  MyChart is used to connect with patients for Virtual Visits (Telemedicine).  Patients are able to view lab/test results, encounter notes, upcoming appointments, etc.  Non-urgent messages can be sent to your provider as well.   To learn more about what you can do with MyChart, go to [email or web address].    Your next appointment:   6-7 week(s)  The format for your next appointment:   In Person  Provider:   Lennie Odor, MD

## 2021-03-19 ENCOUNTER — Telehealth: Payer: Self-pay | Admitting: Cardiovascular Disease

## 2021-03-19 NOTE — Telephone Encounter (Signed)
[facility] received forms from [employer], forms were put in provider box 5/6

## 2021-03-19 NOTE — Telephone Encounter (Signed)
Just gave it to you

## 2021-03-26 DIAGNOSIS — Z0279 Encounter for issue of other medical certificate: Secondary | ICD-10-CM

## 2021-03-26 NOTE — Telephone Encounter (Signed)
Calvin Hancock is calling stating the short term disability that has been extended has not been received by [name].  He spoke with the this morning confirming this, so he is requesting it be refaxed. He would like a callback confirming it was sent. Please advise.

## 2021-05-09 NOTE — Progress Notes (Signed)
[facility]  Cardiology Office Note:    Date:  05/10/2021  NAME:  Calvin Hancock    MRN: [id number] DOB:  28-Jul-1966   PCP:  Shirline Frees, MD  Cardiologist:  Evalina Field, MD  Electrophysiologist:  None   Referring MD: Shirline Frees, MD   Chief Complaint  Patient presents with   Follow-up   History of Present Illness:    Calvin Hancock is a 55 y.o. male with a hx of myopericarditis (01/25/2021), HFpEF, hypertension, morbid obesity who presents for follow-up.  He reports he is doing well since her last visit.  Still has some chest pain symptoms described as tightness when he exerts himself.  Symptoms have improved.  No rapid heartbeat sensation.  His weight is up 8 pounds.  He does get short of breath with activity.  I suspect this is deconditioning.  He has been on limited activity for the past 3 months.  He has returned to work.  He reports it was difficult.  I have encouraged him to continue with this.  He does have heart failure with preserved ejection fraction.  No evidence of volume overload today on examination.  His weights continue to go up.  He does not appear to have any volume overload.  I simply suspect he is deconditioned.  I have encouraged him to remain as active as possible.  We did tell him that we can help fill out any short-term disability work that is needed.  Apparently there was some paperwork sent last week.  We will track this down.  He overall is doing much better.  Likely can return to full work.  Problem List 1. Obesity -BMI 47 2. HTN 3. OSA 4. Myopericarditis 01/25/2021 -LHC normal -CMR with myopericarditis 5. HLD -T chol 171, HDL 45, LDL 96, TG 152 -A1c 6.2 6.  HFpEF -LVEDP 30 mmHG 01/25/2021  Past Medical History: Past Medical History:  Diagnosis Date   ED (erectile dysfunction)    Elevated troponin level not due myocardial infarction, +chest pain 01/23/2021   Gout    Hypertension    Migraine     Myocarditis ([place])    Sleep apnea     Past Surgical History: Past Surgical History:  Procedure Laterality Date   LEFT HEART CATH AND CORONARY ANGIOGRAPHY N/A 01/25/2021   Procedure: LEFT HEART CATH AND CORONARY ANGIOGRAPHY;  Surgeon: Nelva Bush, MD;  Location: [facility];  Service: Cardiovascular;  Laterality: N/A;   PATELLA FRACTURE SURGERY      Current Medications: Current Meds  Medication Sig   allopurinol (ZYLOPRIM) 100 MG tablet Take 100 mg by mouth daily.   amLODipine (NORVASC) 10 MG tablet Take 10 mg by mouth in the morning.   colchicine 0.6 MG tablet TAKE 1 TABLET (0.6 MG TOTAL) BY MOUTH 2 (TWO) TIMES DAILY.   Multiple Vitamin (MULTIVITAMIN WITH MINERALS) TABS tablet Take 1 tablet by mouth in the morning.   olmesartan (BENICAR) 40 MG tablet Take 20 mg by mouth every morning.     Allergies:    Patient has no known allergies.   Social History: Social History   Socioeconomic History   Marital status: Single    Spouse name: Not on file   Number of children: Not on file   Years of education: Not on file   Highest education level: Not on file  Occupational History   Not on file  Tobacco Use   Smoking status: Never  Smokeless tobacco: Never  Substance and Sexual Activity   Alcohol use: Yes    Alcohol/week: 0.0 standard drinks    Comment: social   Drug use: No   Sexual activity: Not on file  Other Topics Concern   Not on file  Social History Narrative   Not on file   Social Determinants of Health   Financial Resource Strain: Not on file  Food Insecurity: Not on file  Transportation Needs: Not on file  Physical Activity: Not on file  Stress: Not on file  Social Connections: Not on file     Family History: The patient's family history includes Diabetes in his father; Hypertension in his mother.  ROS:   All other ROS reviewed and negative. Pertinent positives noted in the HPI.    EKGs/Labs/Other Studies Reviewed:   The following studies were  personally reviewed by me today:  CMR 01/27/2021 IMPRESSION: 1.  Normal left ventricular function, LVEF 55%.   2. Parametric mapping and gadolinium enhancement pattern consistent with myocarditis; modified [facility] Criteria met.   3. Increase in pericardial thickening and enhancement consistent with pericarditis.   4.  Study consistent with myopericarditis.  Recent Labs: 01/23/2021: ALT 23 01/25/2021: TSH 3.344 01/26/2021: Magnesium 2.0 02/06/2021: BUN 19; Creatinine, Ser 1.30; Hemoglobin 15.7; Platelets 237; Potassium 3.5; Sodium 136   Recent Lipid Panel    Component Value Date/Time   CHOL 171 01/25/2021 0324   TRIG 152 (H) 01/25/2021 0324   HDL 45 01/25/2021 0324   CHOLHDL 3.8 01/25/2021 0324   VLDL 30 01/25/2021 0324   LDLCALC 96 01/25/2021 0324    Physical Exam:   VS:  BP 140/88   Pulse 74   Ht 5' 10"  (1.778 m)   Wt (!) 328 lb 9.6 oz (149.1 kg)   SpO2 95%   BMI 47.15 kg/m    Wt Readings from Last 3 Encounters:  05/10/21 (!) 328 lb 9.6 oz (149.1 kg)  03/17/21 (!) 319 lb (144.7 kg)  02/06/21 (!) 321 lb (145.6 kg)    General: Well nourished, well developed, in no acute distress Head: Atraumatic, normal size  Eyes: PEERLA, EOMI  Neck: Supple, no JVD Endocrine: No thryomegaly Cardiac: Normal S1, S2; RRR; no murmurs, rubs, or gallops Lungs: Clear to auscultation bilaterally, no wheezing, rhonchi or rales  Abd: Soft, nontender, no hepatomegaly  Ext: No edema, pulses 2+ Musculoskeletal: No deformities, BUE and BLE strength normal and equal Skin: Warm and dry, no rashes   Neuro: Alert and oriented to person, place, time, and situation, CNII-XII grossly intact, no focal deficits  Psych: Normal mood and affect   ASSESSMENT:   [name] is a 55 y.o. male who presents for the following: 1. Myopericarditis   2. Chronic diastolic heart failure ([place])   3. Primary hypertension   4. Mixed hyperlipidemia   5. Obesity, morbid, BMI 40.0-49.9 ([place])     PLAN:   1.  Myopericarditis -Admitted to the hospital in March with non-STEMI.  Found to have normal coronaries.  Had myocarditis on cardiac MRI. -Recent monitor did have PVCs.  No symptoms of palpitations. -He continues to have symptoms of shortness of breath.  I highly suspect this is related to heart failure preserved ejection fraction.  Heart function was normal on MRI.  He did have elevated filling pressures on cath.  This is consistent with heart failure preserved ejection fraction.  I suspect this is the etiology of his shortness of breath.  He has no significant chest pain  symptoms. -He may return to full activity without restrictions.  I encouraged him to remain active.  No need for repeat cardiac MRI. -I suspect his symptoms are really related to inactivity.  He has been on limited activity for the past 3 months.  Hopefully this will improve. -I would like for him to complete 3 more months of colchicine therapy.  This will be a total of 6 months.  I will stop this at her next visit.  2. Chronic diastolic heart failure ([place]) 3. Primary hypertension -He did have feeling pressures that were elevated on left heart cath in March.  Does not appear volume overloaded.  We will continue with his HCTZ for now.  He will also continue with aggressive blood pressure control.  I recommended regular activity and weight loss.  He would like to be referred to healthy weight and wellness.  He is interested in weight loss surgery.  4. Mixed hyperlipidemia -Continue with diet and exercise for now.  Normal coronary arteries.  5. Obesity, morbid, BMI 40.0-49.9 ([place]) -Referral to weight loss management.  He would like to consider gastric bypass surgery.  Disposition: Return in about 3 months (around 08/10/2021).  Medication Adjustments/Labs and Tests Ordered: Current medicines are reviewed at length with the patient today.  Concerns regarding medicines are outlined above.  Orders Placed This Encounter  Procedures    Amb Ref to Medical Weight Management    No orders of the defined types were placed in this encounter.   Patient Instructions  Medication Instructions:  The current medical regimen is effective;  continue present plan and medications.  *If you need a refill on your cardiac medications before your next appointment, please call your pharmacy*   Follow-Up: At [facility], you and your health needs are our priority.  As part of our continuing mission to provide you with exceptional heart care, we have created designated Provider Care Teams.  These Care Teams include your primary Cardiologist (physician) and Advanced Practice Providers (APPs -  Physician Assistants and Nurse Practitioners) who all work together to provide you with the care you need, when you need it.  We recommend signing up for the patient portal called "MyChart".  Sign up information is provided on this After Visit Summary.  MyChart is used to connect with patients for Virtual Visits (Telemedicine).  Patients are able to view lab/test results, encounter notes, upcoming appointments, etc.  Non-urgent messages can be sent to your provider as well.   To learn more about what you can do with MyChart, go to [email or web address].    Your next appointment:   3 month(s)  The format for your next appointment:   In Person  Provider:   Eleonore Chiquito, MD   Other Instructions Referral to Healthy Weight and Wellness     Time Spent with Patient: I have spent a total of 25 minutes with patient reviewing hospital notes, telemetry, EKGs, labs and examining the patient as well as establishing an assessment and plan that was discussed with the patient.  > 50% of time was spent in direct patient care.  Signed, Addison Naegeli. Audie Box, MD, [place]  [address], [place], [place] [address] [phone]  05/10/2021 12:52 PM

## 2021-05-10 ENCOUNTER — Other Ambulatory Visit: Payer: Self-pay

## 2021-05-10 ENCOUNTER — Ambulatory Visit (INDEPENDENT_AMBULATORY_CARE_PROVIDER_SITE_OTHER): Payer: Managed Care, Other (non HMO) | Admitting: Cardiovascular Disease

## 2021-05-10 ENCOUNTER — Encounter (HOSPITAL_BASED_OUTPATIENT_CLINIC_OR_DEPARTMENT_OTHER): Payer: Self-pay | Admitting: Cardiovascular Disease

## 2021-05-10 VITALS — BP 140/88 | HR 74 | Ht 70.0 in | Wt 328.6 lb

## 2021-05-10 DIAGNOSIS — I5032 Chronic diastolic (congestive) heart failure: Secondary | ICD-10-CM

## 2021-05-10 DIAGNOSIS — I10 Essential (primary) hypertension: Secondary | ICD-10-CM | POA: Diagnosis not present

## 2021-05-10 DIAGNOSIS — E782 Mixed hyperlipidemia: Secondary | ICD-10-CM

## 2021-05-10 DIAGNOSIS — I319 Disease of pericardium, unspecified: Secondary | ICD-10-CM | POA: Diagnosis not present

## 2021-05-10 NOTE — Patient Instructions (Signed)
Medication Instructions:  The current medical regimen is effective;  continue present plan and medications.  *If you need a refill on your cardiac medications before your next appointment, please call your pharmacy*   Follow-Up: At [facility], you and your health needs are our priority.  As part of our continuing mission to provide you with exceptional heart care, we have created designated Provider Care Teams.  These Care Teams include your primary Cardiologist (physician) and Advanced Practice Providers (APPs -  Physician Assistants and Nurse Practitioners) who all work together to provide you with the care you need, when you need it.  We recommend signing up for the patient portal called "MyChart".  Sign up information is provided on this After Visit Summary.  MyChart is used to connect with patients for Virtual Visits (Telemedicine).  Patients are able to view lab/test results, encounter notes, upcoming appointments, etc.  Non-urgent messages can be sent to your provider as well.   To learn more about what you can do with MyChart, go to [email or web address].    Your next appointment:   3 month(s)  The format for your next appointment:   In Person  Provider:   Lennie Odor, MD   Other Instructions Referral to Healthy [facility] Wellness

## 2021-05-13 ENCOUNTER — Encounter (HOSPITAL_BASED_OUTPATIENT_CLINIC_OR_DEPARTMENT_OTHER): Payer: Self-pay

## 2021-05-25 ENCOUNTER — Encounter (HOSPITAL_BASED_OUTPATIENT_CLINIC_OR_DEPARTMENT_OTHER): Payer: Self-pay | Admitting: Cardiovascular Disease

## 2021-05-26 ENCOUNTER — Other Ambulatory Visit: Payer: Self-pay | Admitting: Cardiovascular Disease

## 2021-06-10 NOTE — Telephone Encounter (Signed)
Yes I will

## 2021-06-10 NOTE — Telephone Encounter (Signed)
No, we have not received anything for Mr.Renfro yet.

## 2021-06-11 ENCOUNTER — Telehealth: Payer: Self-pay | Admitting: Cardiovascular Disease

## 2021-06-11 NOTE — Telephone Encounter (Signed)
Received forms from [employer]. Forms were put in Dr.O'neal box. Patient was contacted to inform him that we have received his forms. When they are complete we will fax them in, and contact patient.

## 2021-06-14 NOTE — Telephone Encounter (Signed)
Forms received back from provider, and  faxed to the [place]. Patient was contacted to give update on the status of his forms. I informed patient I will mail him a copy.

## 2021-07-27 DIAGNOSIS — Z0289 Encounter for other administrative examinations: Secondary | ICD-10-CM

## 2021-08-05 ENCOUNTER — Other Ambulatory Visit: Payer: Self-pay

## 2021-08-05 ENCOUNTER — Ambulatory Visit (INDEPENDENT_AMBULATORY_CARE_PROVIDER_SITE_OTHER): Payer: Managed Care, Other (non HMO) | Admitting: Family Medicine

## 2021-08-05 ENCOUNTER — Encounter (INDEPENDENT_AMBULATORY_CARE_PROVIDER_SITE_OTHER): Payer: Self-pay | Admitting: Family Medicine

## 2021-08-05 VITALS — BP 130/75 | HR 65 | Temp 97.9°F | Ht 70.0 in | Wt 317.0 lb

## 2021-08-05 DIAGNOSIS — R5383 Other fatigue: Secondary | ICD-10-CM

## 2021-08-05 DIAGNOSIS — R7303 Prediabetes: Secondary | ICD-10-CM

## 2021-08-05 DIAGNOSIS — G4733 Obstructive sleep apnea (adult) (pediatric): Secondary | ICD-10-CM

## 2021-08-05 DIAGNOSIS — Z6841 Body Mass Index (BMI) 40.0 and over, adult: Secondary | ICD-10-CM

## 2021-08-05 DIAGNOSIS — R0602 Shortness of breath: Secondary | ICD-10-CM

## 2021-08-05 DIAGNOSIS — Z9189 Other specified personal risk factors, not elsewhere classified: Secondary | ICD-10-CM

## 2021-08-05 DIAGNOSIS — Z1331 Encounter for screening for depression: Secondary | ICD-10-CM | POA: Diagnosis not present

## 2021-08-05 DIAGNOSIS — E781 Pure hyperglyceridemia: Secondary | ICD-10-CM

## 2021-08-05 DIAGNOSIS — I10 Essential (primary) hypertension: Secondary | ICD-10-CM | POA: Diagnosis not present

## 2021-08-05 NOTE — Progress Notes (Signed)
Dear Flora Lipps,   Thank you for referring Calvin Hancock to our clinic. The following note includes my evaluation and treatment recommendations.  Chief Complaint:   OBESITY Calvin Hancock (MR# [id number]) is a 55 y.o. male who presents for evaluation and treatment of obesity and related comorbidities. Current BMI is Body mass index is 45.48 kg/m. Calvin Hancock has been struggling with his weight for many years and has been unsuccessful in either losing weight, maintaining weight loss, or reaching his healthy weight goal.  Calvin Hancock is currently in the action stage of change and ready to dedicate time achieving and maintaining a healthier weight. Calvin Hancock is interested in becoming our patient and working on intensive lifestyle modifications including (but not limited to) diet and exercise for weight loss.  Referred by Dr. Flora Lipps. Pt is still on medication for myopericarditis. 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 Premier shake in the mornings + 2 eggs (satisfied); [employer] 6 inch with baked chips or salad from [employer] with grilled nuggets (12) (feel satisfied); Dinner- if working days: 45 oz baked chicken with rice (1 cup) +/- min vegetable.  Calvin Hancock's habits were reviewed today and are as follows: His family eats meals together, he thinks his family will eat healthier with him, his desired weight loss is 102 lbs, he started gaining weight after getting out of the [employer], his heaviest weight ever was 336 pounds, he is a picky eater and doesn't like to eat healthier foods, he has significant food cravings issues, he snacks frequently in the evenings, he skips meals frequently, he is frequently drinking liquids with calories, he frequently makes poor food choices, he has binge eating behaviors, and he struggles with emotional eating.  Depression Screen Calvin Hancock's Food and Mood (modified PHQ-9) score was 11.  Depression screen [facility] 2/9 08/05/2021   Decreased Interest 1  Down, Depressed, Hopeless 1  PHQ - 2 Score 2  Altered sleeping 3  Tired, decreased energy 3  Change in appetite 1  Feeling bad or failure about yourself  1  Trouble concentrating 1  Moving slowly or fidgety/restless 0  Suicidal thoughts 0  PHQ-9 Score 11  Difficult doing work/chores Somewhat difficult   Subjective:   1. Other fatigue Calvin Hancock admits to daytime somnolence and admits to waking up still tired. Patent has a history of symptoms of daytime fatigue and morning fatigue. Calvin Hancock generally gets  4-6  hours of sleep per night, and states that he has generally restful sleep. Snoring is present. Apneic episodes are present. Epworth Sleepiness Score is 7. EKG- 1 PVC, otherwise EKG similar to EKG in March 2022.  2. SOB (shortness of breath) Calvin Hancock notes increasing shortness of breath with exercising and seems to be worsening over time with weight gain. He notes getting out of breath sooner with activity than he used to. This has gotten worse recently. [name] denies shortness of breath at rest or orthopnea. EKG- 1 PVC, otherwise EKG similar to EKG in March 2022.  3. Essential hypertension Diagnosed for many years. BP normally well controlled. Pt is on olmesartan, HCTZ, and amlodipine. Pt denies chest pain/chest pressure/headache.  4. Pre-diabetes Calvin Hancock's last A1c was 6.2 in March, and he is not on medication.  5. OSA (obstructive sleep apnea) Diagnosed over 10 years ago. Pt has a CPAP and wears it mostly everyday.  6. Hypertriglyceridemia Elevated triglycerides in March 2022. Pt is not on meds.  7. At risk for deficient intake  of food Calvin Hancock is at risk for deficient intake of food due to skipping meals.  Assessment/Plan:   1. Other fatigue [name] does feel that his weight is causing his energy to be lower than it should be. Fatigue may be related to obesity, depression or many other causes. Labs will be ordered, and in the meanwhile, [name] will focus on  self care including making healthy food choices, increasing physical activity and focusing on stress reduction. Check labs today.  - EKG 12-Lead - Vitamin B12 - Folate - T3 - T4, free - TSH - VITAMIN D 25 Hydroxy (Vit-D Deficiency, Fractures)  2. SOB (shortness of breath) Calvin Hancock does feel that he gets out of breath more easily that he used to when he exercises. [name]'s shortness of breath appears to be obesity related and exercise induced. He has agreed to work on weight loss and gradually increase exercise to treat his exercise induced shortness of breath. Will continue to monitor closely. Check labs today.  - CBC with Differential/Platelet  3. Essential hypertension Calvin Hancock is working on healthy weight loss and exercise to improve blood pressure control. We will watch for signs of hypotension as he continues his lifestyle modifications. Check labs today.  - Comprehensive metabolic panel - Lipid Panel With LDL/HDL Ratio  4. Pre-diabetes Calvin Hancock will continue to work on weight loss, exercise, and decreasing simple carbohydrates to help decrease the risk of diabetes. Check labs today.  - Hemoglobin A1c - Insulin, random  5. OSA (obstructive sleep apnea) Intensive lifestyle modifications are the first line treatment for this issue. We discussed several lifestyle modifications today and he will continue to work on diet, exercise and weight loss efforts. We will continue to monitor. Orders and follow up as documented in patient record. Check labs today.  - CBC with Differential/Platelet  6. Hypertriglyceridemia Cardiovascular risk and specific lipid/LDL goals reviewed.  We discussed several lifestyle modifications today and [name] will continue to work on diet, exercise and weight loss efforts. Orders and follow up as documented in patient record.   Counseling Intensive lifestyle modifications are the first line treatment for this issue. Dietary changes: Increase soluble fiber.  Decrease simple carbohydrates. Exercise changes: Moderate to vigorous-intensity aerobic activity 150 minutes per week if tolerated. Lipid-lowering medications: see documented in medical record. Check labs today.  - Lipid Panel With LDL/HDL Ratio  7. Depression screening Calvin Hancock had a positive depression screening. Depression is commonly associated with obesity and often results in emotional eating behaviors. We will monitor this closely and work on CBT to help improve the non-hunger eating patterns. Referral to Psychology may be required if no improvement is seen as he continues in our clinic.  8. At risk for deficient intake of food Calvin Hancock was given approximately 15 minutes of deficit intake of food prevention counseling today. Calvin Hancock is at risk for eating too few calories based on current food recall. He was encouraged to focus on meeting caloric and protein goals according to his recommended meal plan.   9. Obesity with current BMI of 45.5  Calvin Hancock is currently in the action stage of change and his goal is to continue with weight loss efforts. I recommend Calvin Hancock begin the structured treatment plan as follows:  He has agreed to the Category 3 Plan.  Exercise goals:  As is    Behavioral modification strategies: increasing lean protein intake, no skipping meals, meal planning and cooking strategies, and keeping healthy foods in the home.  He was informed of the importance of frequent  follow-up visits to maximize his success with intensive lifestyle modifications for his multiple health conditions. He was informed we would discuss his lab results at his next visit unless there is a critical issue that needs to be addressed sooner. Calvin Hancock agreed to keep his next visit at the agreed upon time to discuss these results.  Objective:   Blood pressure 130/75, pulse 65, temperature 97.9 F (36.6 C), height 5\' 10"  (1.778 m), weight (!) 317 lb (143.8 kg), SpO2 98 %. Body mass index is 45.48  kg/m.  EKG: Abnormal- sinus rhythm, rate 66.  Indirect Calorimeter completed today shows a VO2 of 284 and a REE of 1958.  His calculated basal metabolic rate is thus his basal metabolic rate is worse than expected.  General: Cooperative, alert, well developed, in no acute distress. HEENT: Conjunctivae and lids unremarkable. Cardiovascular: Regular rhythm.  Lungs: Normal work of breathing. Neurologic: No focal deficits.   Lab Results  Component Value Date   CREATININE 1.30 (H) 02/06/2021   BUN 19 02/06/2021   NA 136 02/06/2021   K 3.5 02/06/2021   CL 99 02/06/2021   CO2 29 02/06/2021   Lab Results  Component Value Date   ALT 23 01/23/2021   AST 24 01/23/2021   ALKPHOS 42 01/23/2021   BILITOT 0.3 01/23/2021   Lab Results  Component Value Date   HGBA1C 6.2 (H) 01/25/2021   No results found for: INSULIN Lab Results  Component Value Date   TSH 3.344 01/25/2021   Lab Results  Component Value Date   CHOL 171 01/25/2021   HDL 45 01/25/2021   LDLCALC 96 01/25/2021   TRIG 152 (H) 01/25/2021   CHOLHDL 3.8 01/25/2021   Lab Results  Component Value Date   WBC 6.0 02/06/2021   HGB 15.7 02/06/2021   HCT 48.3 02/06/2021   MCV 86.7 02/06/2021   PLT 237 02/06/2021    Attestation Statements:   Reviewed by clinician on day of visit: allergies, medications, problem list, medical history, surgical history, family history, social history, and previous encounter notes.  02/08/2021, CMA, am acting as transcriptionist for Edmund Hilda, MD.   This is the patient's first visit at [facility]. The patient's NEW PATIENT PACKET was reviewed at length. Included in the packet: current and past health history, medications, allergies, ROS, gynecologic history (women only), surgical history, family history, social history, weight history, weight loss surgery history (for those that have had weight loss surgery), nutritional evaluation, mood and food  questionnaire, PHQ9, Epworth questionnaire, sleep habits questionnaire, patient life and health improvement goals questionnaire. These will all be scanned into the patient's chart under media.   During the visit, I independently reviewed the patient's EKG, bioimpedance scale results, and indirect calorimeter results. I used this information to tailor a meal plan for the patient that will help him to lose weight and will improve his obesity-related conditions going forward. I performed a medically necessary appropriate examination and/or evaluation. I discussed the assessment and treatment plan with the patient. The patient was provided an opportunity to ask questions and all were answered. The patient agreed with the plan and demonstrated an understanding of the instructions. Labs were ordered at this visit and will be reviewed at the next visit unless more critical results need to be addressed immediately. Clinical information was updated and documented in the EMR.   Time spent on visit including pre-visit chart review and post-visit care was 45 minutes.   A separate 15 minutes  was spent on risk counseling (see above).  I have reviewed the above documentation for accuracy and completeness, and I agree with the above. - Calvin Likes, MD

## 2021-08-06 ENCOUNTER — Encounter (HOSPITAL_COMMUNITY): Payer: Self-pay | Admitting: Emergency Medicine

## 2021-08-06 ENCOUNTER — Emergency Department (HOSPITAL_COMMUNITY)
Admission: EM | Admit: 2021-08-06 | Discharge: 2021-08-07 | Disposition: A | Discharge status: Home / Self Care | Payer: Managed Care, Other (non HMO) | Attending: Emergency Medicine | Admitting: Emergency Medicine

## 2021-08-06 ENCOUNTER — Other Ambulatory Visit: Payer: Self-pay

## 2021-08-06 ENCOUNTER — Emergency Department (HOSPITAL_COMMUNITY): Payer: Managed Care, Other (non HMO)

## 2021-08-06 DIAGNOSIS — R0600 Dyspnea, unspecified: Secondary | ICD-10-CM | POA: Insufficient documentation

## 2021-08-06 DIAGNOSIS — R0789 Other chest pain: Secondary | ICD-10-CM | POA: Diagnosis present

## 2021-08-06 DIAGNOSIS — I10 Essential (primary) hypertension: Secondary | ICD-10-CM | POA: Insufficient documentation

## 2021-08-06 DIAGNOSIS — Z20822 Contact with and (suspected) exposure to covid-19: Secondary | ICD-10-CM | POA: Diagnosis not present

## 2021-08-06 DIAGNOSIS — R072 Precordial pain: Secondary | ICD-10-CM | POA: Insufficient documentation

## 2021-08-06 LAB — CBC WITH DIFFERENTIAL/PLATELET
Abs Immature Granulocytes: 0.02 10*3/uL (ref 0.00–0.07)
Basophils Absolute: 0.1 10*3/uL (ref 0.0–0.1)
Basophils Relative: 1 %
Eosinophils Absolute: 0.1 10*3/uL (ref 0.0–0.5)
Eosinophils Relative: 2 %
HCT: 46.7 % (ref 39.0–52.0)
Hemoglobin: 15.2 g/dL (ref 13.0–17.0)
Immature Granulocytes: 0 %
Lymphocytes Relative: 47 %
Lymphs Abs: 2.5 10*3/uL (ref 0.7–4.0)
MCH: 28.8 pg (ref 26.0–34.0)
MCHC: 32.5 g/dL (ref 30.0–36.0)
MCV: 88.4 fL (ref 80.0–100.0)
Monocytes Absolute: 0.5 10*3/uL (ref 0.1–1.0)
Monocytes Relative: 9 %
Neutro Abs: 2.2 10*3/uL (ref 1.7–7.7)
Neutrophils Relative %: 41 %
Platelets: 230 10*3/uL (ref 150–400)
RBC: 5.28 MIL/uL (ref 4.22–5.81)
RDW: 14.8 % (ref 11.5–15.5)
WBC: 5.4 10*3/uL (ref 4.0–10.5)
nRBC: 0 % (ref 0.0–0.2)

## 2021-08-06 LAB — COMPREHENSIVE METABOLIC PANEL
ALT: 31 U/L (ref 0–44)
AST: 29 U/L (ref 15–41)
Albumin: 3.9 g/dL (ref 3.5–5.0)
Alkaline Phosphatase: 34 U/L — ABNORMAL LOW (ref 38–126)
Anion gap: 8 (ref 5–15)
BUN: 15 mg/dL (ref 6–20)
CO2: 31 mmol/L (ref 22–32)
Calcium: 9.3 mg/dL (ref 8.9–10.3)
Chloride: 100 mmol/L (ref 98–111)
Creatinine, Ser: 1.18 mg/dL (ref 0.61–1.24)
GFR, Estimated: >60 mL/min (ref >60)
Glucose, Bld: 102 mg/dL — ABNORMAL HIGH (ref 70–99)
Potassium: 3.9 mmol/L (ref 3.5–5.1)
Sodium: 139 mmol/L (ref 135–145)
Total Bilirubin: 0.6 mg/dL (ref 0.3–1.2)
Total Protein: 6.8 g/dL (ref 6.5–8.1)

## 2021-08-06 LAB — TROPONIN I (HIGH SENSITIVITY)
Troponin I (High Sensitivity): 8 ng/L (ref <18)
Troponin I (High Sensitivity): 8 ng/L (ref <18)

## 2021-08-06 NOTE — ED Provider Notes (Signed)
Emergency Medicine Provider Triage Evaluation Note  Calvin Hancock , a 55 y.o. male  was evaluated in triage.  Pt complains of history of myocarditis presented today with chest pain that began approximately 6 PM.  He states that his sternal nonradiating associate with shortness of breath no diaphoresis wheezing cough congestion fevers or chills.  Follows with cardiology last note 05/10/2021.  History of HFpEF hypertension morbid obesity  Review of Systems  Positive: CP, SOB Negative: fever  Physical Exam  BP 117/81   Pulse 77   Temp 98.7 F (37.1 C) (Oral)   Resp 16   SpO2 98%  Gen:   Awake, no distress   Resp:  Normal effort  MSK:   Moves extremities without difficulty  Other:  Lungs clear to auscultation. Speaking full sentences.  Medical Decision Making  Medically screening exam initiated at 8:08 PM.  Appropriate orders placed.  DUWANE GEWIRTZ was informed that the remainder of the evaluation will be completed by another provider, this initial triage assessment does not replace that evaluation, and the importance of remaining in the ED until their evaluation is complete.  Patient with history of myocarditis presented today with chest pain that he states feels similar.  It did come on while he was exerting himself however he does not describe it is either pleuritic or exertional worsened.  Seems to be constant since 6 PM.  No hemoptysis denies any leg swelling.   Gailen Shelter, [place] 08/06/21 2011    Koleen Distance, MD 08/06/21 [phone]

## 2021-08-06 NOTE — ED Triage Notes (Signed)
Sudden onset of left sided chest pressure.  Complaint w/ medication.

## 2021-08-07 LAB — BRAIN NATRIURETIC PEPTIDE: B Natriuretic Peptide: 5.1 pg/mL (ref 0.0–100.0)

## 2021-08-07 LAB — RESP PANEL BY RT-PCR (FLU A&B, COVID) ARPGX2
Influenza A by PCR: NEGATIVE
Influenza B by PCR: NEGATIVE
SARS Coronavirus 2 by RT PCR: NEGATIVE

## 2021-08-07 MED ORDER — COLCHICINE 0.6 MG PO TABS
0.6000 mg | ORAL_TABLET | Freq: Once | ORAL | Status: AC
  Administered 2021-08-07: 0.6 mg via ORAL
  Filled 2021-08-07: qty 1

## 2021-08-07 NOTE — Discharge Instructions (Signed)
Please continue take your home medications as prescribed.  Your lab work here is reassuring and your COVID test is negative.  Please keep your follow-up appointment with your cardiologist.  Return to the emergency department any new or suddenly worsening symptoms.

## 2021-08-07 NOTE — ED Provider Notes (Signed)
Emergency Department Provider Note   I have reviewed the triage vital signs and the nursing notes.   HISTORY  Chief Complaint Chest Pain   HPI Calvin Hancock is a 55 y.o. 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  He states pain initially reminded him of his myocarditis discomfort.  It began suddenly while at work today.  Has not had any fevers, chills, other flulike symptoms.  He is remained compliant with his home medications including his colchicine.  No prior history of CAD although does have a family history.    Past Medical History:  Diagnosis Date   ED (erectile dysfunction)    Elevated troponin level not due myocardial infarction, +chest pain 01/23/2021   Gout    Hypertension    Joint pain    Migraine    Myocarditis (HCC)    Prediabetes    Sleep apnea     Patient Active Problem List   Diagnosis Date Noted   Myopericarditis 01/27/2021   HTN (hypertension) 01/24/2021   OSA (obstructive sleep apnea) 01/24/2021   Elevated troponin level not due myocardial infarction, +chest pain 01/23/2021   Severe obesity (BMI >= 40) (HCC) 12/15/2015   Dyspnea 12/14/2015    Past Surgical History:  Procedure Laterality Date   LEFT HEART CATH AND CORONARY ANGIOGRAPHY N/A 01/25/2021   Procedure: LEFT HEART CATH AND CORONARY ANGIOGRAPHY;  Surgeon: [name], MD;  Location: [facility];  Service: Cardiovascular;  Laterality: N/A;   PATELLA FRACTURE SURGERY      Allergies Patient has no known allergies.  Family History  Problem Relation Age of Onset   Heart disease Mother    Hypertension Mother    Hyperlipidemia Father    Hypertension Father    Diabetes Father    Heart disease Father    Sudden death Father    Sleep apnea Father    Alcoholism Father     Social History Social History   Tobacco Use   Smoking status: Never    Smokeless tobacco: Never  Substance Use Topics   Alcohol use: Yes    Alcohol/week: 0.0 standard drinks    Comment: social   Drug use: No    Review of Systems  Constitutional: No fever/chills Eyes: No visual changes. ENT: No sore throat. Cardiovascular: Positive chest pain. Respiratory: Denies shortness of breath. Gastrointestinal: No abdominal pain.  No nausea, no vomiting.  No diarrhea.  No constipation. Genitourinary: Negative for dysuria. Musculoskeletal: Negative for back pain. Skin: Negative for rash. Neurological: Negative for headaches, focal weakness or numbness.  10-point ROS otherwise negative.  ____________________________________________   PHYSICAL EXAM:  VITAL SIGNS: ED Triage Vitals  Enc Vitals Group     BP 08/06/21 1959 117/81     Pulse Rate 08/06/21 1959 77     Resp 08/06/21 1959 16     Temp 08/06/21 1959 98.7 F (37.1 C)     Temp Source 08/06/21 1959 Oral     SpO2 08/06/21 1959 98 %    Constitutional: Alert and oriented. Well appearing and in no acute distress. Eyes: Conjunctivae are normal. Head: Atraumatic. Nose: No congestion/rhinnorhea. Mouth/Throat: Mucous membranes are moist.  Neck: No stridor.   Cardiovascular: Normal rate, regular rhythm. Good peripheral circulation. Grossly normal heart sounds.   Respiratory: Normal respiratory effort.  No retractions. Lungs CTAB. Gastrointestinal: Soft and nontender. No distention.  Musculoskeletal: No lower extremity  tenderness nor edema. No gross deformities of extremities. Neurologic:  Normal speech and language. No gross focal neurologic deficits are appreciated.  Skin:  Skin is warm, dry and intact. No rash noted.  ____________________________________________   LABS (all labs ordered are listed, but only abnormal results are displayed)  Labs Reviewed  COMPREHENSIVE METABOLIC PANEL - Abnormal; Notable for the following components:      Result Value   Glucose, Bld 102 (*)    Alkaline  Phosphatase 34 (*)    All other components within normal limits  RESP PANEL BY RT-PCR (FLU A&B, COVID) ARPGX2  CBC WITH DIFFERENTIAL/PLATELET  BRAIN NATRIURETIC PEPTIDE  TROPONIN I (HIGH SENSITIVITY)  TROPONIN I (HIGH SENSITIVITY)   ____________________________________________  EKG   EKG Interpretation  Date/Time:  Friday August 06 2021 20:02:43 EDT Ventricular Rate:  78 PR Interval:  150 QRS Duration: 112 QT Interval:  412 QTC Calculation: 469 R Axis:   81 Text Interpretation: Normal sinus rhythm Incomplete right bundle branch block Borderline ECG Confirmed by Alona Bene [phone]) on 08/07/2021 1:03:27 AM        ____________________________________________  RADIOLOGY  DG Chest 2 View  Result Date: 08/06/2021 CLINICAL DATA:  Chest pain and shortness of breath. EXAM: CHEST - 2 VIEW COMPARISON:  Radiograph 02/06/2021 FINDINGS: Mild eventration of right hemidiaphragm, chronic and unchanged.The cardiomediastinal contours are normal. Pulmonary vasculature is normal. No consolidation, pleural effusion, or pneumothorax. No acute osseous abnormalities are seen. IMPRESSION: No acute chest findings. Electronically Signed   By: Narda Rutherford M.D.   On: 08/06/2021 20:58    ____________________________________________   PROCEDURES  Procedure(s) performed:   Procedures  None  ____________________________________________   INITIAL IMPRESSION / ASSESSMENT AND PLAN / ED COURSE  Pertinent labs & imaging results that were available during my care of the patient were reviewed by me and considered in my medical decision making (see chart for details).   Patient presents emergency department valuation of chest pain.  Feels to him similar to his prior myocarditis.  Not having infection symptoms.  Initial troponin is negative.   Differential includes all life-threatening causes for chest pain. This includes but is not exclusive to acute coronary syndrome, aortic dissection,  pulmonary embolism, cardiac tamponade, community-acquired pneumonia, pericarditis, musculoskeletal chest wall pain, etc.  Delta troponins unremarkable.  COVID-negative.  Chest x-ray reviewed.  Patient has close cardiology follow-up in the coming week.  We will have him continue his colchicine for now but does not seem consistent with myocarditis, CAD, PE, or CHF.  Discuss strict ED return precautions.  ____________________________________________  FINAL CLINICAL IMPRESSION(S) / ED DIAGNOSES  Final diagnoses:  Precordial pain    MEDICATIONS GIVEN DURING THIS VISIT:  Medications  colchicine tablet 0.6 mg (0.6 mg Oral Given 08/07/21 0314)    Note:  This document was prepared using Dragon voice recognition software and may include unintentional dictation errors.  Alona Bene, MD, [facility]    Yossi Hinchman, Arlyss Repress, MD 08/09/21 [phone]

## 2021-08-10 LAB — LIPID PANEL WITH LDL/HDL RATIO
Cholesterol, Total: 179 mg/dL (ref 100–199)
HDL: 52 mg/dL (ref >39)
LDL Chol Calc (NIH): 116 mg/dL — ABNORMAL HIGH (ref 0–99)
LDL/HDL Ratio: 2.2 ratio (ref 0.0–3.6)
Triglycerides: 55 mg/dL (ref 0–149)
VLDL Cholesterol Cal: 11 mg/dL (ref 5–40)

## 2021-08-10 LAB — CBC WITH DIFFERENTIAL/PLATELET
Basophils Absolute: 0.1 10*3/uL (ref 0.0–0.2)
Basos: 1 %
EOS (ABSOLUTE): 0.2 10*3/uL (ref 0.0–0.4)
Eos: 3 %
Hematocrit: 48.5 % (ref 37.5–51.0)
Hemoglobin: 15.5 g/dL (ref 13.0–17.7)
Immature Grans (Abs): 0 10*3/uL (ref 0.0–0.1)
Immature Granulocytes: 0 %
Lymphocytes Absolute: 2.1 10*3/uL (ref 0.7–3.1)
Lymphs: 45 %
MCH: 27.7 pg (ref 26.6–33.0)
MCHC: 32 g/dL (ref 31.5–35.7)
MCV: 87 fL (ref 79–97)
Monocytes Absolute: 0.4 10*3/uL (ref 0.1–0.9)
Monocytes: 9 %
Neutrophils Absolute: 2 10*3/uL (ref 1.4–7.0)
Neutrophils: 42 %
Platelets: 219 10*3/uL (ref 150–450)
RBC: 5.59 x10E6/uL (ref 4.14–5.80)
RDW: 13.4 % (ref 11.6–15.4)
WBC: 4.8 10*3/uL (ref 3.4–10.8)

## 2021-08-10 LAB — COMPREHENSIVE METABOLIC PANEL
ALT: 32 IU/L (ref 0–44)
AST: 27 IU/L (ref 0–40)
Albumin/Globulin Ratio: 2 (ref 1.2–2.2)
Albumin: 4.5 g/dL (ref 3.8–4.9)
Alkaline Phosphatase: 41 IU/L — ABNORMAL LOW (ref 44–121)
BUN/Creatinine Ratio: 16 (ref 9–20)
BUN: 17 mg/dL (ref 6–24)
Bilirubin Total: 0.4 mg/dL (ref 0.0–1.2)
CO2: 28 mmol/L (ref 20–29)
Calcium: 9.3 mg/dL (ref 8.7–10.2)
Chloride: 102 mmol/L (ref 96–106)
Creatinine, Ser: 1.05 mg/dL (ref 0.76–1.27)
Globulin, Total: 2.2 g/dL (ref 1.5–4.5)
Glucose: 90 mg/dL (ref 65–99)
Potassium: 4.2 mmol/L (ref 3.5–5.2)
Sodium: 143 mmol/L (ref 134–144)
Total Protein: 6.7 g/dL (ref 6.0–8.5)
eGFR: 85 mL/min/{1.73_m2} (ref >59)

## 2021-08-10 LAB — HEMOGLOBIN A1C
Est. average glucose Bld gHb Est-mCnc: 123 mg/dL
Hgb A1c MFr Bld: 5.9 % — ABNORMAL HIGH (ref 4.8–5.6)

## 2021-08-10 LAB — TSH: TSH: 1.09 u[IU]/mL (ref 0.450–4.500)

## 2021-08-10 LAB — INSULIN, RANDOM: INSULIN: 12.5 u[IU]/mL (ref 2.6–24.9)

## 2021-08-10 LAB — T4, FREE: Free T4: 1.35 ng/dL (ref 0.82–1.77)

## 2021-08-10 LAB — VITAMIN D 25 HYDROXY (VIT D DEFICIENCY, FRACTURES): Vit D, 25-Hydroxy: 43.8 ng/mL (ref 30.0–100.0)

## 2021-08-10 LAB — FOLATE: Folate: 17.8 ng/mL (ref >3.0)

## 2021-08-10 LAB — T3: T3, Total: 107 ng/dL (ref 71–180)

## 2021-08-10 LAB — VITAMIN B12: Vitamin B-12: 728 pg/mL (ref 232–1245)

## 2021-08-11 ENCOUNTER — Encounter (INDEPENDENT_AMBULATORY_CARE_PROVIDER_SITE_OTHER): Payer: Self-pay

## 2021-08-12 NOTE — Progress Notes (Signed)
Cardiology Office Note:    Date:  08/13/2021  NAME:  Calvin Hancock    MRN: [id number] DOB:  03-27-1966   PCP:  Shirline Frees, MD  Cardiologist:  Evalina Field, MD  Electrophysiologist:  None   Referring MD: Shirline Frees, MD   Chief Complaint  Patient presents with   Chest Pain   History of Present Illness:    Calvin Hancock is a 55 y.o. male with a hx of myopericarditis, HTN, obesity, OSA who presents for follow-up. Seen in ER 9/23 for CP. Troponin negative. EKG with iRBBB.  He reports last Friday he was seen in the emergency room.  Developed pressure in his chest when he got off work.  Reports activity made it worse.  Symptoms have lessened and are not as severe.  He was seen in the emergency room and EKG was unchanged from prior and troponins were negative.  He reports he is dieting and has lost 12 pounds.  He met with a nutritionist.  He is motivated to lose weight.  Blood pressure 118/77.  His heart cath earlier this year showed no evidence of CAD.  Troponins were negative which means myocarditis is not back.  He was continued on colchicine.  He reports activity can make it worse.  He is slightly tender with palpation.  He reports he has had some fatigue and not felt well over the past 2 weeks.  I wonder if there is costochondritis here.  His inflammatory markers in the hospital were negative.  We will repeat those.  His blood pressure is better.  Volume status is acceptable.  He is currently euvolemic.  He does report that his wife informs him he is breathing heavily at times.  Today he looks more comfortable.  All of his work-up in the ER was negative.  Problem List 1. Obesity -BMI 45 2. HTN 3. OSA 4. Myopericarditis 01/25/2021 -LHC normal -CMR with myopericarditis 5. HLD -T chol 171, HDL 45, LDL 96, TG 152 -A1c 6.2 6.  HFpEF -LVEDP 30 mmHG 01/25/2021  Past Medical History: Past Medical History:  Diagnosis Date   ED (erectile dysfunction)    Elevated troponin  level not due myocardial infarction, +chest pain 01/23/2021   Gout    Hypertension    Joint pain    Migraine    Myocarditis ([place])    Prediabetes    Sleep apnea     Past Surgical History: Past Surgical History:  Procedure Laterality Date   LEFT HEART CATH AND CORONARY ANGIOGRAPHY N/A 01/25/2021   Procedure: LEFT HEART CATH AND CORONARY ANGIOGRAPHY;  Surgeon: Nelva Bush, MD;  Location: [facility];  Service: Cardiovascular;  Laterality: N/A;   PATELLA FRACTURE SURGERY      Current Medications: Current Meds  Medication Sig   ALLOPURINOL PO Take 200 mg by mouth daily.   amLODipine (NORVASC) 10 MG tablet Take 10 mg by mouth in the morning.   colchicine 0.6 MG tablet TAKE 1 TABLET BY MOUTH 2 TIMES DAILY.   ibuprofen (ADVIL) 800 MG tablet Take 1 tablet (800 mg total) by mouth 3 (three) times daily.   Multiple Vitamin (MULTIVITAMIN WITH MINERALS) TABS tablet Take 1 tablet by mouth in the morning.   olmesartan (BENICAR) 40 MG tablet Take 20 mg by mouth every morning.   pantoprazole (PROTONIX) 40 MG tablet Take 1 tablet (40 mg total) by mouth daily.     Allergies:    Patient has no known allergies.   Social  History: Social History   Socioeconomic History   Marital status: Single    Spouse name: Not on file   Number of children: Not on file   Years of education: Not on file   Highest education level: Not on file  Occupational History   Occupation: Public affairs consultant  Tobacco Use   Smoking status: Never   Smokeless tobacco: Never  Substance and Sexual Activity   Alcohol use: Yes    Alcohol/week: 0.0 standard drinks    Comment: social   Drug use: No   Sexual activity: Not on file  Other Topics Concern   Not on file  Social History Narrative   ** Merged History Encounter **       Social Determinants of Health   Financial Resource Strain: Not on file  Food Insecurity: Not on file  Transportation Needs: Not on file  Physical Activity: Not on file  Stress:  Not on file  Social Connections: Not on file     Family History: The patient's family history includes Alcoholism in his father; Diabetes in his father; Heart disease in his father and mother; Hyperlipidemia in his father; Hypertension in his father and mother; Sleep apnea in his father; Sudden death in his father.  ROS:   All other ROS reviewed and negative. Pertinent positives noted in the HPI.    EKGs/Labs/Other Studies Reviewed:   The following studies were personally reviewed by me today:  [facility] 02/04/2021 Impression: 1. Brief non-sustained ventricular tachycardia detected (1 episode; 8.7 second duration).  2. Occasional PVCs (1.3% burden).  TTE 01/24/2021  1. Left ventricular ejection fraction, by estimation, is 50 to 55%. The  left ventricle has low normal function. Left ventricular endocardial  border not optimally defined to evaluate regional wall motion, probable  lateral and anterior wall motion  abnormality. There is mild left ventricular hypertrophy. Left ventricular  diastolic parameters were normal.   2. Right ventricular systolic function is normal. The right ventricular  size is normal. Tricuspid regurgitation signal is inadequate for assessing  PA pressure.   3. The mitral valve is normal in structure. No evidence of mitral valve  regurgitation. No evidence of mitral stenosis.   4. The aortic valve is normal in structure. Aortic valve regurgitation is  not visualized. No aortic stenosis is present.   5. Aortic dilatation noted. There is borderline dilatation of the aortic  root, measuring 40 mm.   6. The inferior vena cava is normal in size with greater than 50%  respiratory variability, suggesting right atrial pressure of 3 mmHg.    LHC 01/25/2021 Recommendations: Consider cardiac MRI to evaluate for evidence of myocarditis/myopericarditis. Gentle diuresis in the setting of elevated LVEDP and low normal LVEF consistent with acute HFpEF. Primary prevention of  coronary artery disease.  Recent Labs: 01/26/2021: Magnesium 2.0 08/05/2021: TSH 1.090 08/06/2021: ALT 31; B Natriuretic Peptide 5.1; BUN 15; Creatinine, Ser 1.18; Hemoglobin 15.2; Platelets 230; Potassium 3.9; Sodium 139   Recent Lipid Panel    Component Value Date/Time   CHOL 179 08/05/2021 0850   TRIG 55 08/05/2021 0850   HDL 52 08/05/2021 0850   CHOLHDL 3.8 01/25/2021 0324   VLDL 30 01/25/2021 0324   LDLCALC 116 (H) 08/05/2021 0850    Physical Exam:   VS:  BP 118/77   Pulse 68   Ht _0  (1.778 m)   Wt (!) 316 lb (143.3 kg)   SpO2 98%   BMI 45.34 kg/m    Wt Readings from Last 3  Encounters:  08/13/21 (!) 316 lb (143.3 kg)  08/05/21 (!) 317 lb (143.8 kg)  05/10/21 (!) 328 lb 9.6 oz (149.1 kg)    General: Well nourished, well developed, in no acute distress Head: Atraumatic, normal size  Eyes: PEERLA, EOMI  Neck: Supple, no JVD Endocrine: No thryomegaly Cardiac: Normal S1, S2; RRR; no murmurs, rubs, or gallops Lungs: Clear to auscultation bilaterally, no wheezing, rhonchi or rales  Abd: Soft, nontender, no hepatomegaly  Ext: No edema, pulses 2+ Musculoskeletal: No deformities, BUE and BLE strength normal and equal Skin: Warm and dry, no rashes   Neuro: Alert and oriented to person, place, time, and situation, CNII-XII grossly intact, no focal deficits  Psych: Normal mood and affect   ASSESSMENT:   Calvin Hancock is a 55 y.o. male who presents for the following: 1. Chest pain of uncertain etiology   2. Myopericarditis   3. Chronic diastolic heart failure ([place])   4. Primary hypertension   5. Mixed hyperlipidemia   6. Obesity, morbid, BMI 40.0-49.9 ([place])     PLAN:   1. Chest pain of uncertain etiology -Seen in the emergency room last week.  EKG was unremarkable and unchanged.  Troponins were negative.  I do not believe this represents continued myocarditis.  He reports he has lost weight.  I wonder if this could just be acid reflux.  We will start him on Protonix  40 mg daily.  I would also like to pursue a short course of ibuprofen in the case this is costochondritis.  He reports feeling poorly over the past 2 weeks.  He could have a viral illness.  He is slightly tender on palpation.  We will prescribe ibuprofen 800 mg 3 times daily for 7 days.  He has no limitations.  He can go back to exercising.  We will also repeat ESR and CRP.  These were negative in the hospital.  Suspect they will remain negative.  2. Myopericarditis -Diagnosed in March 2022.  Left heart cath was normal.  CMR did show inferolateral LGE enhancement.  Sed rate and CRP were normal.  Symptoms had been treated with ibuprofen as well as colchicine.  He had recurrent chest pain symptoms but I do not believe this represents recurrent myocarditis given negative troponins.  He will continue colchicine for now.  3. Chronic diastolic heart failure ([place]) -Euvolemic on exam.  We will continue with blood pressure control.  He is on a thiazide diuretic.  BP well controlled.  He is losing weight.  This will help.  4. Primary hypertension -Continue current blood pressure medications.  No change.  5. Mixed hyperlipidemia -Normal coronaries.  No need for statin.  LDL 116.  6. Obesity, morbid, BMI 40.0-49.9 ([place]) -Weight loss recommended.  Disposition: Return in about 3 months (around 11/12/2021).  Medication Adjustments/Labs and Tests Ordered: Current medicines are reviewed at length with the patient today.  Concerns regarding medicines are outlined above.  Orders Placed This Encounter  Procedures   C-reactive protein   Sedimentation rate    Meds ordered this encounter  Medications   pantoprazole (PROTONIX) 40 MG tablet    Sig: Take 1 tablet (40 mg total) by mouth daily.    Dispense:  30 tablet    Refill:  11   ibuprofen (ADVIL) 800 MG tablet    Sig: Take 1 tablet (800 mg total) by mouth 3 (three) times daily.    Dispense:  21 tablet    Refill:  0  Patient Instructions   Medication Instructions:  Start Protonix 40 mg daily Start Ibuprofen 800 mg three times daily for 7 days.   *If you need a refill on your cardiac medications before your next appointment, please call your pharmacy*   Lab Work: CRP, SED RATE ([place], [place] [id number], no lab appointment)   If you have labs (blood work) drawn today and your tests are completely normal, you will receive your results only by: [place] (if you have MyChart) OR A paper copy in the mail If you have any lab test that is abnormal or we need to change your treatment, we will call you to review the results.   Follow-Up: At [facility], you and your health needs are our priority.  As part of our continuing mission to provide you with exceptional heart care, we have created designated Provider Care Teams.  These Care Teams include your primary Cardiologist (physician) and Advanced Practice Providers (APPs -  Physician Assistants and Nurse Practitioners) who all work together to provide you with the care you need, when you need it.  We recommend signing up for the patient portal called "MyChart".  Sign up information is provided on this After Visit Summary.  MyChart is used to connect with patients for Virtual Visits (Telemedicine).  Patients are able to view lab/test results, encounter notes, upcoming appointments, etc.  Non-urgent messages can be sent to your provider as well.   To learn more about what you can do with MyChart, go to [email or web address].    Your next appointment:   3 month(s)  The format for your next appointment:   In Person  Provider:   Eleonore Chiquito, MD     Time Spent with Patient: I have spent a total of 35 minutes with patient reviewing hospital notes, telemetry, EKGs, labs and examining the patient as well as establishing an assessment and plan that was discussed with the patient.  > 50% of time was spent in direct patient care.  Signed, Addison Naegeli. Audie Box,  MD, [facility]  [address], [place], [place] [address] [phone]  08/13/2021 10:07 AM

## 2021-08-13 ENCOUNTER — Ambulatory Visit (INDEPENDENT_AMBULATORY_CARE_PROVIDER_SITE_OTHER): Payer: Managed Care, Other (non HMO) | Admitting: Cardiovascular Disease

## 2021-08-13 ENCOUNTER — Encounter (HOSPITAL_BASED_OUTPATIENT_CLINIC_OR_DEPARTMENT_OTHER): Payer: Self-pay | Admitting: Cardiovascular Disease

## 2021-08-13 ENCOUNTER — Other Ambulatory Visit: Payer: Self-pay

## 2021-08-13 VITALS — BP 118/77 | HR 68 | Ht 70.0 in | Wt 316.0 lb

## 2021-08-13 DIAGNOSIS — R079 Chest pain, unspecified: Secondary | ICD-10-CM

## 2021-08-13 DIAGNOSIS — I319 Disease of pericardium, unspecified: Secondary | ICD-10-CM

## 2021-08-13 DIAGNOSIS — E782 Mixed hyperlipidemia: Secondary | ICD-10-CM

## 2021-08-13 DIAGNOSIS — I10 Essential (primary) hypertension: Secondary | ICD-10-CM | POA: Diagnosis not present

## 2021-08-13 DIAGNOSIS — I5032 Chronic diastolic (congestive) heart failure: Secondary | ICD-10-CM | POA: Diagnosis not present

## 2021-08-13 MED ORDER — PANTOPRAZOLE SODIUM 40 MG PO TBEC: 40.0000 mg | DELAYED_RELEASE_TABLET | Freq: Every day | ORAL | 11 refills | Status: DC

## 2021-08-13 MED ORDER — IBUPROFEN 800 MG PO TABS: 800.0000 mg | ORAL_TABLET | Freq: Three times a day (TID) | ORAL | 0 refills | Status: DC

## 2021-08-13 NOTE — Patient Instructions (Signed)
Medication Instructions:  Start Protonix 40 mg daily Start Ibuprofen 800 mg three times daily for 7 days.   *If you need a refill on your cardiac medications before your next appointment, please call your pharmacy*   Lab Work: CRP, SED RATE ([address], [place] [address], no lab appointment)   If you have labs (blood work) drawn today and your tests are completely normal, you will receive your results only by: MyChart Message (if you have MyChart) OR A paper copy in the mail If you have any lab test that is abnormal or we need to change your treatment, we will call you to review the results.   Follow-Up: At [facility], you and your health needs are our priority.  As part of our continuing mission to provide you with exceptional heart care, we have created designated Provider Care Teams.  These Care Teams include your primary Cardiologist (physician) and Advanced Practice Providers (APPs -  Physician Assistants and Nurse Practitioners) who all work together to provide you with the care you need, when you need it.  We recommend signing up for the patient portal called "MyChart".  Sign up information is provided on this After Visit Summary.  MyChart is used to connect with patients for Virtual Visits (Telemedicine).  Patients are able to view lab/test results, encounter notes, upcoming appointments, etc.  Non-urgent messages can be sent to your provider as well.   To learn more about what you can do with MyChart, go to [email or web address].    Your next appointment:   3 month(s)  The format for your next appointment:   In Person  Provider:   Lennie Odor, MD

## 2021-08-19 ENCOUNTER — Encounter (INDEPENDENT_AMBULATORY_CARE_PROVIDER_SITE_OTHER): Payer: Self-pay

## 2021-08-19 ENCOUNTER — Ambulatory Visit (INDEPENDENT_AMBULATORY_CARE_PROVIDER_SITE_OTHER): Payer: Managed Care, Other (non HMO) | Admitting: Family Medicine

## 2021-08-20 LAB — SEDIMENTATION RATE: Sed Rate: 3 mm/hr (ref 0–30)

## 2021-08-20 LAB — C-REACTIVE PROTEIN: CRP: 3 mg/L (ref 0–10)

## 2021-09-06 ENCOUNTER — Other Ambulatory Visit: Payer: Self-pay

## 2021-09-06 ENCOUNTER — Ambulatory Visit (INDEPENDENT_AMBULATORY_CARE_PROVIDER_SITE_OTHER): Payer: Managed Care, Other (non HMO) | Admitting: Family Medicine

## 2021-09-06 ENCOUNTER — Encounter (INDEPENDENT_AMBULATORY_CARE_PROVIDER_SITE_OTHER): Payer: Self-pay | Admitting: Family Medicine

## 2021-09-06 VITALS — BP 104/66 | HR 63 | Temp 97.9°F | Ht 70.0 in | Wt 302.0 lb

## 2021-09-06 DIAGNOSIS — E7849 Other hyperlipidemia: Secondary | ICD-10-CM

## 2021-09-06 DIAGNOSIS — Z6841 Body Mass Index (BMI) 40.0 and over, adult: Secondary | ICD-10-CM

## 2021-09-06 DIAGNOSIS — R7303 Prediabetes: Secondary | ICD-10-CM

## 2021-09-06 DIAGNOSIS — Z9189 Other specified personal risk factors, not elsewhere classified: Secondary | ICD-10-CM | POA: Diagnosis not present

## 2021-09-06 MED ORDER — METFORMIN HCL 500 MG PO TABS: 500.0000 mg | ORAL_TABLET | Freq: Every day | ORAL | 0 refills | Status: DC

## 2021-09-06 NOTE — Progress Notes (Signed)
Chief Complaint:   OBESITY Calvin Hancock is here to discuss his progress with his obesity treatment plan along with follow-up of his obesity related diagnoses. Calvin Hancock is on the Category 3 Plan and states he is following his eating plan approximately 100% of the time. Calvin Hancock states he is doing 0 minutes 0 times per week.  Today's visit was #: 2 Starting weight: 316 lbs Starting date: 08/05/2021 Today's weight: 302 lbs Today's date: 09/06/2021 Total lbs lost to date: 14 Total lbs lost since last in-office visit: 14  Interim History: Calvin Hancock has done well with weight loss on his Category 3 plan. He notes increased PM hunger and it turns out that he was only eating approximately 50% of his dinner protein by accident.  Subjective:   1. Pre-diabetes Calvin Hancock's A1c is elevated at 5.9. He is working on diet, exercise, and weight loss. He notes some increased PM polyphagia, and he notes a family history of diabetes mellitus II. I discussed labs with the patient today.  2. Hyperlipidemia, pure Calvin Hancock has a new diagnosis of hyperlipidemia. His LDL is > 100 and he is not on statin. He is followed by Cardiology and he is working on diet, exercise, and weight loss. I discussed labs with the patient today.  3. At risk for diabetes mellitus Calvin Hancock is at higher than average risk for developing diabetes due to obesity.   Assessment/Plan:   1. Pre-diabetes Calvin Hancock agreed to start metformin 500 mg q daily with no refills. He will continue with diet and exercise to help decrease the risk of diabetes.   - metFORMIN (GLUCOPHAGE) 500 MG tablet; Take 1 tablet (500 mg total) by mouth daily with breakfast.  Dispense: 30 tablet; Refill: 0  2. Hyperlipidemia, pure Cardiovascular risk and specific lipid/LDL goals reviewed. We discussed several lifestyle modifications today. Calvin Hancock will continue to decrease his cholesterol in his diet, and we will recheck labs in 3 months. Orders and follow up as documented in  patient record.   3. At risk for diabetes mellitus Calvin Hancock was given approximately 30 minutes of diabetes education and counseling today. We discussed intensive lifestyle modifications today with an emphasis on weight loss as well as increasing exercise and decreasing simple carbohydrates in his diet. We also reviewed medication options with an emphasis on risk versus benefit of those discussed.   Repetitive spaced learning was employed today to elicit superior memory formation and behavioral change.  4. Obesity with current BMI of 43.4 Calvin Hancock is currently in the action stage of change. As such, his goal is to continue with weight loss efforts. He has agreed to the Category 3 Plan.   Calvin Hancock is to increase her dinner protein to 8-10 oz and will continue to follow up.  Behavioral modification strategies: increasing lean protein intake.  Calvin Hancock has agreed to follow-up with our clinic in 2 to 3 weeks. He was informed of the importance of frequent follow-up visits to maximize his success with intensive lifestyle modifications for his multiple health conditions.   Objective:   Blood pressure 104/66, pulse 63, temperature 97.9 F (36.6 C), height 5\' 10"  (1.778 m), weight (!) 302 lb (137 kg), SpO2 97 %. Body mass index is 43.33 kg/m.  General: Cooperative, alert, well developed, in no acute distress. HEENT: Conjunctivae and lids unremarkable. Cardiovascular: Regular rhythm.  Lungs: Normal work of breathing. Neurologic: No focal deficits.   Lab Results  Component Value Date   CREATININE 1.18 08/06/2021   BUN 15 08/06/2021   NA 139  08/06/2021   K 3.9 08/06/2021   CL 100 08/06/2021   CO2 31 08/06/2021   Lab Results  Component Value Date   ALT 31 08/06/2021   AST 29 08/06/2021   ALKPHOS 34 (L) 08/06/2021   BILITOT 0.6 08/06/2021   Lab Results  Component Value Date   HGBA1C 5.9 (H) 08/05/2021   HGBA1C 6.2 (H) 01/25/2021   Lab Results  Component Value Date   INSULIN 12.5  08/05/2021   Lab Results  Component Value Date   TSH 1.090 08/05/2021   Lab Results  Component Value Date   CHOL 179 08/05/2021   HDL 52 08/05/2021   LDLCALC 116 (H) 08/05/2021   TRIG 55 08/05/2021   CHOLHDL 3.8 01/25/2021   Lab Results  Component Value Date   VD25OH 43.8 08/05/2021   Lab Results  Component Value Date   WBC 5.4 08/06/2021   HGB 15.2 08/06/2021   HCT 46.7 08/06/2021   MCV 88.4 08/06/2021   PLT 230 08/06/2021   No results found for: IRON, TIBC, FERRITIN  Attestation Statements:   Reviewed by clinician on day of visit: allergies, medications, problem list, medical history, surgical history, family history, social history, and previous encounter notes.   I, Burt Knack, am acting as transcriptionist for Quillian Quince, MD.  I have reviewed the above documentation for accuracy and completeness, and I agree with the above. -  Quillian Quince, MD

## 2021-09-20 ENCOUNTER — Other Ambulatory Visit: Payer: Self-pay

## 2021-09-20 ENCOUNTER — Encounter (INDEPENDENT_AMBULATORY_CARE_PROVIDER_SITE_OTHER): Payer: Self-pay | Admitting: Family Medicine

## 2021-09-20 ENCOUNTER — Ambulatory Visit (INDEPENDENT_AMBULATORY_CARE_PROVIDER_SITE_OTHER): Payer: Managed Care, Other (non HMO) | Admitting: Family Medicine

## 2021-09-20 VITALS — BP 99/60 | HR 65 | Temp 98.0°F | Ht 70.0 in | Wt 295.0 lb

## 2021-09-20 DIAGNOSIS — R7303 Prediabetes: Secondary | ICD-10-CM

## 2021-09-20 DIAGNOSIS — E559 Vitamin D deficiency, unspecified: Secondary | ICD-10-CM

## 2021-09-20 DIAGNOSIS — Z6841 Body Mass Index (BMI) 40.0 and over, adult: Secondary | ICD-10-CM

## 2021-09-20 DIAGNOSIS — I10 Essential (primary) hypertension: Secondary | ICD-10-CM

## 2021-09-20 MED ORDER — AMLODIPINE BESYLATE 10 MG PO TABS: 5.0000 mg | ORAL_TABLET | Freq: Every morning | ORAL | Status: DC

## 2021-09-21 NOTE — Progress Notes (Signed)
Chief Complaint:   OBESITY Calvin Hancock is here to discuss his progress with his obesity treatment plan along with follow-up of his obesity related diagnoses. Calvin Hancock is on the Category 3 Plan and states he is following his eating plan approximately 99% of the time. Calvin Hancock states he is walking 1 mile 3-4 times per week.  Today's visit was #: 3 Starting weight: 316 lbs Starting date: 08/05/2021 Today's weight: 295 lbs Today's date: 09/20/2021 Total lbs lost to date: 21 Total lbs lost since last in-office visit: 7  Interim History: Calvin Hancock has been following category 3 as strictly as he can. He reports some hunger between lunch and dinner. Timeframe is 6-8 hours between those meals. He is doing skinny pop for snacks. Pt is now working on getting 8-10 oz protein at dinner. He anticipates no obstacles in the next few weeks. He is interested in maybe incorporating eating out.  Subjective:   1. Pre-diabetes Calvin Hancock's A1c is 5.9 and insulin level 12.5. He is on Metformin.  2. Vitamin D deficiency Calvin Hancock is on OTC Vit D supplementation. He reports fatigue.  3. Essential hypertension BP low normal today at 99/60.  Assessment/Plan:   1. Pre-diabetes Calvin Hancock will continue to work on weight loss, exercise, and decreasing simple carbohydrates to help decrease the risk of diabetes. Continue Metformin. No refill needed today.  2. Vitamin D deficiency Low Vitamin D level contributes to fatigue and are associated with obesity, breast, and colon cancer. He agrees to continue to take OTC Vitamin D daily and will follow-up for routine testing of Vitamin D, at least 2-3 times per year to avoid over-replacement.  3. Essential hypertension Calvin Hancock will decrease amlodipine to 5 mg daily and follow up with PCP and cardiology. He is working on healthy weight loss and exercise to improve blood pressure control. We will watch for signs of hypotension as he continues his lifestyle modifications.  Decrease and  Refill- amLODipine (NORVASC) 10 MG tablet; Take 0.5 tablets (5 mg total) by mouth in the morning.  4. Obesity with current BMI of 42.3  Calvin Hancock is currently in the action stage of change. As such, his goal is to continue with weight loss efforts. He has agreed to the Category 3 Plan + 100 calories.   Exercise goals: All adults should avoid inactivity. Some physical activity is better than none, and adults who participate in any amount of physical activity gain some health benefits.  Behavioral modification strategies: increasing lean protein intake, meal planning and cooking strategies, keeping healthy foods in the home, and planning for success.  Calvin Hancock has agreed to follow-up with our clinic in 2 weeks. He was informed of the importance of frequent follow-up visits to maximize his success with intensive lifestyle modifications for his multiple health conditions.   Objective:   Blood pressure 99/60, pulse 65, temperature 98 F (36.7 C), height 5\' 10"  (1.778 m), weight 295 lb (133.8 kg), SpO2 99 %. Body mass index is 42.33 kg/m.  General: Cooperative, alert, well developed, in no acute distress. HEENT: Conjunctivae and lids unremarkable. Cardiovascular: Regular rhythm.  Lungs: Normal work of breathing. Neurologic: No focal deficits.   Lab Results  Component Value Date   CREATININE 1.18 08/06/2021   BUN 15 08/06/2021   NA 139 08/06/2021   K 3.9 08/06/2021   CL 100 08/06/2021   CO2 31 08/06/2021   Lab Results  Component Value Date   ALT 31 08/06/2021   AST 29 08/06/2021   ALKPHOS 34 (L) 08/06/2021  BILITOT 0.6 08/06/2021   Lab Results  Component Value Date   HGBA1C 5.9 (H) 08/05/2021   HGBA1C 6.2 (H) 01/25/2021   Lab Results  Component Value Date   INSULIN 12.5 08/05/2021   Lab Results  Component Value Date   TSH 1.090 08/05/2021   Lab Results  Component Value Date   CHOL 179 08/05/2021   HDL 52 08/05/2021   LDLCALC 116 (H) 08/05/2021   TRIG 55 08/05/2021    CHOLHDL 3.8 01/25/2021   Lab Results  Component Value Date   VD25OH 43.8 08/05/2021   Lab Results  Component Value Date   WBC 5.4 08/06/2021   HGB 15.2 08/06/2021   HCT 46.7 08/06/2021   MCV 88.4 08/06/2021   PLT 230 08/06/2021    Attestation Statements:   Reviewed by clinician on day of visit: allergies, medications, problem list, medical history, surgical history, family history, social history, and previous encounter notes.  Edmund Hilda, CMA, am acting as transcriptionist for Reuben Likes, MD.   I have reviewed the above documentation for accuracy and completeness, and I agree with the above. - Reuben Likes, MD

## 2021-09-29 ENCOUNTER — Other Ambulatory Visit (INDEPENDENT_AMBULATORY_CARE_PROVIDER_SITE_OTHER): Payer: Self-pay | Admitting: Family Medicine

## 2021-09-29 ENCOUNTER — Encounter (HOSPITAL_BASED_OUTPATIENT_CLINIC_OR_DEPARTMENT_OTHER): Payer: Self-pay | Admitting: Emergency Medicine

## 2021-09-29 ENCOUNTER — Encounter (INDEPENDENT_AMBULATORY_CARE_PROVIDER_SITE_OTHER): Payer: Self-pay

## 2021-09-29 ENCOUNTER — Emergency Department (HOSPITAL_BASED_OUTPATIENT_CLINIC_OR_DEPARTMENT_OTHER)
Admission: EM | Admit: 2021-09-29 | Discharge: 2021-09-29 | Disposition: A | Discharge status: Home / Self Care | Payer: Managed Care, Other (non HMO) | Attending: Emergency Medicine | Admitting: Emergency Medicine

## 2021-09-29 ENCOUNTER — Other Ambulatory Visit: Payer: Self-pay

## 2021-09-29 ENCOUNTER — Emergency Department (HOSPITAL_BASED_OUTPATIENT_CLINIC_OR_DEPARTMENT_OTHER): Payer: Managed Care, Other (non HMO) | Admitting: Radiology

## 2021-09-29 DIAGNOSIS — R7303 Prediabetes: Secondary | ICD-10-CM

## 2021-09-29 DIAGNOSIS — I10 Essential (primary) hypertension: Secondary | ICD-10-CM | POA: Diagnosis not present

## 2021-09-29 DIAGNOSIS — Z79899 Other long term (current) drug therapy: Secondary | ICD-10-CM | POA: Diagnosis not present

## 2021-09-29 DIAGNOSIS — M25561 Pain in right knee: Secondary | ICD-10-CM | POA: Insufficient documentation

## 2021-09-29 MED ORDER — COLCHICINE 0.6 MG PO TABS: 0.6000 mg | ORAL_TABLET | Freq: Every day | ORAL | 0 refills | Status: DC

## 2021-09-29 MED ORDER — COLCHICINE 0.6 MG PO TABS
1.2000 mg | ORAL_TABLET | Freq: Once | ORAL | Status: AC
  Administered 2021-09-29: 1.2 mg via ORAL
  Filled 2021-09-29: qty 2

## 2021-09-29 MED ORDER — INDOMETHACIN 50 MG PO CAPS: 50.0000 mg | ORAL_CAPSULE | Freq: Two times a day (BID) | ORAL | 0 refills | Status: AC

## 2021-09-29 MED ORDER — IBUPROFEN 400 MG PO TABS
600.0000 mg | ORAL_TABLET | Freq: Once | ORAL | Status: AC
  Administered 2021-09-29: 600 mg via ORAL
  Filled 2021-09-29: qty 1

## 2021-09-29 NOTE — ED Provider Notes (Signed)
[facility] EMERGENCY DEPT Provider Note   CSN: [id number] Arrival date & time: 09/29/21  5053     History Chief Complaint  Patient presents with   Knee Pain    Calvin Hancock is a 55 y.o. male.  Patient presents with chief complaint of right knee pain.  He states has been going on for about 10 days but worse in the last 3 to 4 days.  Describes a sharp and aching pain.  Does not radiate elsewhere.  Initially had left-sided pain for about 2 or 3 days and then it moved over to the right knee.  He has had a history of gout with multiple joints involved.  He thinks years ago he had a right knee involved as well.  He has not taken his Medications at home.  Denies any fall or trauma.  No reports of fevers or chills or vomiting or diarrhea.      Past Medical History:  Diagnosis Date   ED (erectile dysfunction)    Elevated troponin level not due myocardial infarction, +chest pain 01/23/2021   Gout    Hypertension    Joint pain    Migraine    Myocarditis (HCC)    Prediabetes    Sleep apnea     Patient Active Problem List   Diagnosis Date Noted   Myopericarditis 01/27/2021   HTN (hypertension) 01/24/2021   OSA (obstructive sleep apnea) 01/24/2021   Elevated troponin level not due myocardial infarction, +chest pain 01/23/2021   Severe obesity (BMI >= 40) (HCC) 12/15/2015   Dyspnea 12/14/2015    Past Surgical History:  Procedure Laterality Date   LEFT HEART CATH AND CORONARY ANGIOGRAPHY N/A 01/25/2021   Procedure: LEFT HEART CATH AND CORONARY ANGIOGRAPHY;  Surgeon: [name], MD;  Location: [facility] INVASIVE CV LAB;  Service: Cardiovascular;  Laterality: N/A;   PATELLA FRACTURE SURGERY         Family History  Problem Relation Age of Onset   Heart disease Mother    Hypertension Mother    Hyperlipidemia Father    Hypertension Father    Diabetes Father    Heart disease Father    Sudden death Father    Sleep apnea Father    Alcoholism Father      Social History   Tobacco Use   Smoking status: Never   Smokeless tobacco: Never  Substance Use Topics   Alcohol use: Yes    Alcohol/week: 0.0 standard drinks    Comment: social   Drug use: No    Home Medications Prior to Admission medications   Medication Sig Start Date End Date Taking? Authorizing Provider  colchicine 0.6 MG tablet Take 1 tablet (0.6 mg total) by mouth daily for 15 doses. 09/29/21 10/14/21 Yes Cheryll Cockayne, MD  indomethacin (INDOCIN) 50 MG capsule Take 1 capsule (50 mg total) by mouth 2 (two) times daily with a meal for 10 doses. 09/29/21 10/04/21 Yes Cheryll Cockayne, MD  ALLOPURINOL PO Take 200 mg by mouth daily. 02/22/21   [provider]  amLODipine (NORVASC) 10 MG tablet Take 0.5 tablets (5 mg total) by mouth in the morning. 09/20/21   Langston Reusing, MD  Cholecalciferol (VITAMIN D3) 50 MCG (2000 UT) CAPS Take by mouth.    [provider]  hydrochlorothiazide (HYDRODIURIL) 25 MG tablet Take 1 tablet (25 mg total) by mouth daily. 02/04/21 08/05/21  Sande Rives, MD  ibuprofen (ADVIL) 800 MG tablet Take 1 tablet (800 mg total) by mouth 3 (  three) times daily. Patient not taking: Reported on 09/20/2021 08/13/21   Sande Rives, MD  metFORMIN (GLUCOPHAGE) 500 MG tablet Take 1 tablet (500 mg total) by mouth daily with breakfast. 09/06/21   Quillian Quince D, MD  Multiple Vitamin (MULTIVITAMIN WITH MINERALS) TABS tablet Take 1 tablet by mouth in the morning.    [provider]  olmesartan (BENICAR) 40 MG tablet Take 20 mg by mouth every morning.    [provider]  pantoprazole (PROTONIX) 40 MG tablet Take 1 tablet (40 mg total) by mouth daily. 08/13/21   O'Neal, Ronnald Ramp, MD    Allergies    Patient has no known allergies.  Review of Systems   Review of Systems  Constitutional:  Negative for fever.  HENT:  Negative for ear pain and sore throat.   Eyes:  Negative for pain.  Respiratory:  Negative for  cough.   Cardiovascular:  Negative for chest pain.  Gastrointestinal:  Negative for abdominal pain.  Genitourinary:  Negative for flank pain.  Musculoskeletal:  Negative for back pain.  Skin:  Negative for color change and rash.  Neurological:  Negative for syncope.  All other systems reviewed and are negative.  Physical Exam Updated Vital Signs BP (!) 144/72   Pulse 91   Temp 98 F (36.7 C)   Resp 15   Ht 5\' 10"  (1.778 m)   Wt 135.6 kg   SpO2 95%   BMI 42.90 kg/m   Physical Exam Constitutional:      Appearance: He is well-developed.  HENT:     Head: Normocephalic.     Nose: Nose normal.  Eyes:     Extraocular Movements: Extraocular movements intact.  Cardiovascular:     Rate and Rhythm: Normal rate.  Pulmonary:     Effort: Pulmonary effort is normal.  Musculoskeletal:     Comments: Right knee appears edematous.  Increased warmth to touch.  No cellulitis noted.  Intact range of motion to about 30 degrees before becomes too painful.  Otherwise neurovascular intact distally compartments are soft.  Skin:    Coloration: Skin is not jaundiced.  Neurological:     Mental Status: He is alert. Mental status is at baseline.    ED Results / Procedures / Treatments   Labs (all labs ordered are listed, but only abnormal results are displayed) Labs Reviewed - No data to display  EKG None  Radiology DG Knee Complete 4 Views Right  Result Date: 09/29/2021 CLINICAL DATA:  Right knee pain with EXAM: RIGHT KNEE - COMPLETE 4+ VIEW COMPARISON:  None. FINDINGS: There is no acute fracture or dislocation. No significant arthritic changes or joint effusion. The soft tissues are unremarkable. IMPRESSION: Negative. Electronically Signed   By: Elgie Collard M.D.   On: 09/29/2021 20:16    Procedures Procedures   Medications Ordered in ED Medications  colchicine tablet 1.2 mg (1.2 mg Oral Given 09/29/21 2217)  ibuprofen (ADVIL) tablet 600 mg (600 mg Oral Given 09/29/21 2217)     ED Course  I have reviewed the triage vital signs and the nursing notes.  Pertinent labs & imaging results that were available during my care of the patient were reviewed by me and considered in my medical decision making (see chart for details).    MDM Rules/Calculators/A&P                           X-rays unremarkable no acute fracture dislocation or foreign  body noted.  Patient has a known history of gout with multiple joint involvement in the past including the right knee.  I discussed with patient differential including gout flareup and septic joint.  We discussed joint aspiration here in the ER which the patient declined.  Risks and benefits discussed.  Likelihood of septic joint versus gout discussed.  Patient informed that definitive diagnoses will require aspiration of the knee, risks involved infection and pain, risks of not pursuing the procedure involved missing a possible septic joint and increased morbidity and possible loss of right knee and possible severe illness.  Wife was at bedside, all questions were answered.  Patient declined the procedure.  He has decision-making capacity.  Joint decision made at this time.  He prefers to follow-up with his orthopedic team tomorrow.  I advised immediate return if he has fevers, worsening pain, or any additional concerns to return immediately to the ER otherwise follow-up with his orthopedist tomorrow.  Final Clinical Impression(s) / ED Diagnoses Final diagnoses:  Acute pain of right knee    Rx / DC Orders ED Discharge Orders          Ordered    colchicine 0.6 MG tablet  Daily        09/29/21 2220    indomethacin (INDOCIN) 50 MG capsule  2 times daily with meals        09/29/21 2220             Luna Fuse, MD 09/29/21 2220

## 2021-09-29 NOTE — ED Triage Notes (Signed)
Pt presents to ED POV. Pt c/o R knee pain x1w. Pt reports that he went to see PCP and they prescribed steroids for gout treatment. Pt reports no improvement.

## 2021-09-29 NOTE — Discharge Instructions (Signed)
Follow-up with the orthopedic group as discussed tomorrow.  Return immediately back to the ER if:  Your symptoms worsen within the next 12-24 hours. You develop new symptoms such as new fevers, persistent vomiting, new pain, shortness of breath, or new weakness or numbness, or if you have any other concerns.

## 2021-09-29 NOTE — Telephone Encounter (Signed)
Message sent to pt-CAS 

## 2021-10-04 ENCOUNTER — Other Ambulatory Visit: Payer: Self-pay

## 2021-10-04 ENCOUNTER — Encounter (INDEPENDENT_AMBULATORY_CARE_PROVIDER_SITE_OTHER): Payer: Self-pay | Admitting: Family Medicine

## 2021-10-04 ENCOUNTER — Ambulatory Visit (INDEPENDENT_AMBULATORY_CARE_PROVIDER_SITE_OTHER): Payer: Managed Care, Other (non HMO) | Admitting: Family Medicine

## 2021-10-04 VITALS — BP 117/79 | HR 67 | Ht 70.0 in | Wt 293.0 lb

## 2021-10-04 DIAGNOSIS — R7303 Prediabetes: Secondary | ICD-10-CM

## 2021-10-04 DIAGNOSIS — Z6841 Body Mass Index (BMI) 40.0 and over, adult: Secondary | ICD-10-CM

## 2021-10-04 DIAGNOSIS — Z9189 Other specified personal risk factors, not elsewhere classified: Secondary | ICD-10-CM

## 2021-10-04 DIAGNOSIS — I10 Essential (primary) hypertension: Secondary | ICD-10-CM | POA: Diagnosis not present

## 2021-10-04 MED ORDER — METFORMIN HCL 500 MG PO TABS: 500.0000 mg | ORAL_TABLET | Freq: Two times a day (BID) | ORAL | 0 refills | Status: DC

## 2021-10-05 NOTE — Progress Notes (Signed)
Chief Complaint:   OBESITY Calvin Hancock is here to discuss his progress with his obesity treatment plan along with follow-up of his obesity related diagnoses. Calvin Hancock is on the Category 3 Plan and states he is following his eating plan approximately 95% of the time. Calvin Hancock states he is not currently exercising.  Today's visit was #: 4 Starting weight: 316 lbs Starting date: 08/05/2021 Today's weight: 293 lbs Today's date: 10/04/2021 Total lbs lost to date: 23 Total lbs lost since last in-office visit: 2  Interim History: Calvin Hancock has been out of work for 1-2 weeks due to knee pain. He is sitting around the house and feels more hungry between breakfast and lunch and lunch and dinner. He has skinny pop in the morning and 2 100 calorie bags between lunch and dinner. Pt's meal recall is great and he appears to be following the plan.  Subjective:   1. Essential hypertension BP at goal. We decreased Norvasc dose at last OV.   BP Readings from Last 3 Encounters:  10/04/21 117/79  09/29/21 126/76  09/20/21 99/60   Lab Results  Component Value Date   CREATININE 1.18 08/06/2021   CREATININE 1.05 08/05/2021   CREATININE 1.30 (H) 02/06/2021   2. Pre-diabetes Pt reports some carb cravings. Calvin Hancock has a diagnosis of prediabetes based on his elevated HgA1c and was informed this puts him at greater risk of developing diabetes. He continues to work on diet and exercise to decrease his risk of diabetes. He denies nausea or hypoglycemia.  Lab Results  Component Value Date   HGBA1C 5.9 (H) 08/05/2021   Lab Results  Component Value Date   INSULIN 12.5 08/05/2021   3. At risk for side effect of medication Calvin Hancock is at risk for side effects of medication due to increasing Metformin.  Assessment/Plan:  No orders of the defined types were placed in this encounter.   Medications Discontinued During This Encounter  Medication Reason   metFORMIN (GLUCOPHAGE) 500 MG tablet Reorder     Meds  ordered this encounter  Medications   metFORMIN (GLUCOPHAGE) 500 MG tablet    Sig: Take 1 tablet (500 mg total) by mouth 2 (two) times daily with a meal.    Dispense:  60 tablet    Refill:  0     1. Essential hypertension Calvin Hancock is working on healthy weight loss and exercise to improve blood pressure control. We will watch for signs of hypotension as he continues his lifestyle modifications. Continue current treatment plan and prudent nutritional plan.  2. Pre-diabetes Calvin Hancock will increase Metformin to BID (up from once a day). Risks and benefits discussed with pt and counseling done. He will continue to work on weight loss, exercise, and decreasing simple carbohydrates to help decrease the risk of diabetes.   Increase & Refill- metFORMIN (GLUCOPHAGE) 500 MG tablet; Take 1 tablet (500 mg total) by mouth 2 (two) times daily with a meal.  Dispense: 60 tablet; Refill: 0  3. At risk for side effect of medication Calvin Hancock was given approximately 9 minutes of drug side effect counseling today.  We discussed side effect possibility and risk versus benefits. Calvin Hancock agreed to the medication and will contact this office if these side effects are intolerable.  Repetitive spaced learning was employed today to elicit superior memory formation and behavioral change.  4. Obesity with current BMI of 42.2  Calvin Hancock is currently in the action stage of change. As such, his goal is to continue with weight loss efforts. He  has agreed to the Category 3 Plan.   Metformin increased and better "protein dense" snacks discussed with pt. List provided.  Exercise goals:  As is  Behavioral modification strategies: increasing lean protein intake, decreasing simple carbohydrates, holiday eating strategies , and planning for success.  Calvin Hancock has agreed to follow-up with our clinic in 2-3 weeks. He was informed of the importance of frequent follow-up visits to maximize his success with intensive lifestyle modifications for  his multiple health conditions.   Objective:   Blood pressure 117/79, pulse 67, height 5\' 10"  (1.778 m), weight 293 lb (132.9 kg), SpO2 98 %. Body mass index is 42.04 kg/m.  General: Cooperative, alert, well developed, in no acute distress. HEENT: Conjunctivae and lids unremarkable. Cardiovascular: Regular rhythm.  Lungs: Normal work of breathing. Neurologic: No focal deficits.   Lab Results  Component Value Date   CREATININE 1.18 08/06/2021   BUN 15 08/06/2021   NA 139 08/06/2021   K 3.9 08/06/2021   CL 100 08/06/2021   CO2 31 08/06/2021   Lab Results  Component Value Date   ALT 31 08/06/2021   AST 29 08/06/2021   ALKPHOS 34 (L) 08/06/2021   BILITOT 0.6 08/06/2021   Lab Results  Component Value Date   HGBA1C 5.9 (H) 08/05/2021   HGBA1C 6.2 (H) 01/25/2021   Lab Results  Component Value Date   INSULIN 12.5 08/05/2021   Lab Results  Component Value Date   TSH 1.090 08/05/2021   Lab Results  Component Value Date   CHOL 179 08/05/2021   HDL 52 08/05/2021   LDLCALC 116 (H) 08/05/2021   TRIG 55 08/05/2021   CHOLHDL 3.8 01/25/2021   Lab Results  Component Value Date   VD25OH 43.8 08/05/2021   Lab Results  Component Value Date   WBC 5.4 08/06/2021   HGB 15.2 08/06/2021   HCT 46.7 08/06/2021   MCV 88.4 08/06/2021   PLT 230 08/06/2021    Attestation Statements:   Reviewed by clinician on day of visit: allergies, medications, problem list, medical history, surgical history, family history, social history, and previous encounter notes.  08/08/2021, CMA, am acting as transcriptionist for Edmund Hilda, DO.  I have reviewed the above documentation for accuracy and completeness, and I agree with the above. [employer], D.O.  The 21st Century Cures Act was signed into law in 2016 which includes the topic of electronic health records.  This provides immediate access to information in MyChart.  This includes consultation notes, operative notes,  office notes, lab results and pathology reports.  If you have any questions about what you read please let 2017 know at your next visit so we can discuss your concerns and take corrective action if need be.  We are right here with you.

## 2021-10-26 ENCOUNTER — Ambulatory Visit (INDEPENDENT_AMBULATORY_CARE_PROVIDER_SITE_OTHER): Payer: Managed Care, Other (non HMO) | Admitting: Family Medicine

## 2021-10-26 ENCOUNTER — Other Ambulatory Visit: Payer: Self-pay

## 2021-10-26 ENCOUNTER — Encounter (INDEPENDENT_AMBULATORY_CARE_PROVIDER_SITE_OTHER): Payer: Self-pay | Admitting: Family Medicine

## 2021-10-26 VITALS — BP 121/70 | HR 88 | Temp 97.8°F | Ht 70.0 in | Wt 289.0 lb

## 2021-10-26 DIAGNOSIS — Z6841 Body Mass Index (BMI) 40.0 and over, adult: Secondary | ICD-10-CM | POA: Diagnosis not present

## 2021-10-26 DIAGNOSIS — I10 Essential (primary) hypertension: Secondary | ICD-10-CM

## 2021-10-26 DIAGNOSIS — E7849 Other hyperlipidemia: Secondary | ICD-10-CM | POA: Diagnosis not present

## 2021-10-27 ENCOUNTER — Other Ambulatory Visit (INDEPENDENT_AMBULATORY_CARE_PROVIDER_SITE_OTHER): Payer: Self-pay | Admitting: Family Medicine

## 2021-10-27 DIAGNOSIS — R7303 Prediabetes: Secondary | ICD-10-CM

## 2021-10-27 NOTE — Progress Notes (Signed)
Chief Complaint:   OBESITY Calvin Hancock is here to discuss his progress with his obesity treatment plan along with follow-up of his obesity related diagnoses. Calvin Hancock is on the Category 3 Plan and states he is following his eating plan approximately 100% of the time. Calvin Hancock states he is not currently exercising.  Today's visit was #: 5 Starting weight: 316 lbs Starting date: 08/05/2021 Today's weight: 289 lbs Today's date: 10/26/2021 Total lbs lost to date: 27 Total lbs lost since last in-office visit: 4  Interim History: Calvin Hancock could not tolerate the 2nd dose of Metformin, so he went back to one a day. His legs have really been bothering him. He has started doing 3 [place] sausage links for breakfast. He is not sure if he is getting all snack calories. Pt is staying home for Christmas.  Subjective:   1. Essential hypertension BP well controlled. Pt denies chest pain/chest pressure/headache. He is on amlodipine, HCTZ, and Benicar.  2. Other hyperlipidemia Calvin Hancock's last LDL was 116 and he is not on statin therapy.  Assessment/Plan:   1. Essential hypertension Calvin Hancock is working on healthy weight loss and exercise to improve blood pressure control. We will watch for signs of hypotension as he continues his lifestyle modifications. Follow up BP at next appt. If BP is still controlled, we will decrease Benicar.  2. Other hyperlipidemia Cardiovascular risk and specific lipid/LDL goals reviewed.  We discussed several lifestyle modifications today and Calvin Hancock will continue to work on diet, exercise and weight loss efforts. Orders and follow up as documented in patient record. Follow up labs in February.  Counseling Intensive lifestyle modifications are the first line treatment for this issue. Dietary changes: Increase soluble fiber. Decrease simple carbohydrates. Exercise changes: Moderate to vigorous-intensity aerobic activity 150 minutes per week if tolerated. Lipid-lowering medications:  see documented in medical record.  3. Obesity with current BMI of 41.6  Calvin Hancock is currently in the action stage of change. As such, his goal is to continue with weight loss efforts. He has agreed to the Category 3 Plan.   Exercise goals: No exercise has been prescribed at this time.  Behavioral modification strategies: increasing lean protein intake, meal planning and cooking strategies, keeping healthy foods in the home, and planning for success.  Calvin Hancock has agreed to follow-up with our clinic in 3 weeks. He was informed of the importance of frequent follow-up visits to maximize his success with intensive lifestyle modifications for his multiple health conditions.   Objective:   Blood pressure 121/70, pulse 88, temperature 97.8 F (36.6 C), height 5\' 10"  (1.778 m), weight 289 lb (131.1 kg), SpO2 96 %. Body mass index is 41.47 kg/m.  General: Cooperative, alert, well developed, in no acute distress. HEENT: Conjunctivae and lids unremarkable. Cardiovascular: Regular rhythm.  Lungs: Normal work of breathing. Neurologic: No focal deficits.   Lab Results  Component Value Date   CREATININE 1.18 08/06/2021   BUN 15 08/06/2021   NA 139 08/06/2021   K 3.9 08/06/2021   CL 100 08/06/2021   CO2 31 08/06/2021   Lab Results  Component Value Date   ALT 31 08/06/2021   AST 29 08/06/2021   ALKPHOS 34 (L) 08/06/2021   BILITOT 0.6 08/06/2021   Lab Results  Component Value Date   HGBA1C 5.9 (H) 08/05/2021   HGBA1C 6.2 (H) 01/25/2021   Lab Results  Component Value Date   INSULIN 12.5 08/05/2021   Lab Results  Component Value Date   TSH 1.090 08/05/2021  Lab Results  Component Value Date   CHOL 179 08/05/2021   HDL 52 08/05/2021   LDLCALC 116 (H) 08/05/2021   TRIG 55 08/05/2021   CHOLHDL 3.8 01/25/2021   Lab Results  Component Value Date   VD25OH 43.8 08/05/2021   Lab Results  Component Value Date   WBC 5.4 08/06/2021   HGB 15.2 08/06/2021   HCT 46.7 08/06/2021    MCV 88.4 08/06/2021   PLT 230 08/06/2021    Attestation Statements:   Reviewed by clinician on day of visit: allergies, medications, problem list, medical history, surgical history, family history, social history, and previous encounter notes.  Edmund Hilda, CMA, am acting as transcriptionist for Reuben Likes, MD.  I have reviewed the above documentation for accuracy and completeness, and I agree with the above. - Reuben Likes, MD

## 2021-10-28 ENCOUNTER — Other Ambulatory Visit: Payer: Self-pay | Admitting: Family Medicine

## 2021-10-28 DIAGNOSIS — G8929 Other chronic pain: Secondary | ICD-10-CM

## 2021-10-28 DIAGNOSIS — M25561 Pain in right knee: Secondary | ICD-10-CM

## 2021-11-15 ENCOUNTER — Other Ambulatory Visit (INDEPENDENT_AMBULATORY_CARE_PROVIDER_SITE_OTHER): Payer: Self-pay | Admitting: Family Medicine

## 2021-11-15 DIAGNOSIS — R7303 Prediabetes: Secondary | ICD-10-CM

## 2021-11-16 ENCOUNTER — Encounter (INDEPENDENT_AMBULATORY_CARE_PROVIDER_SITE_OTHER): Payer: Self-pay

## 2021-11-16 NOTE — Telephone Encounter (Signed)
Message sent to pt-CAS 

## 2021-11-16 NOTE — Telephone Encounter (Signed)
LAST APPOINTMENT DATE: 10/26/21 NEXT APPOINTMENT DATE: 11/29/21   [id number] - [place] - [address] [place] [address] Phone: [phone] Fax: [phone]  [facility] [address] [place] [address] Phone: [phone] Fax: [phone]  [id number] - [place] [id number] [place] [id number] Phone: [phone] Fax: [phone]  Patient is requesting a refill of the following medications: Pending Prescriptions:                       Disp   Refills   metFORMIN (GLUCOPHAGE) 500 MG tablet [Phar*60 tab*0       Sig: TAKE 1 TABLET BY MOUTH 2 TIMES DAILY WITH A MEAL.   Date last filled: 10/04/21 Previously prescribed by Dr Raliegh Scarlet  Lab Results      Component                Value               Date                      HGBA1C                   5.9 (H)             08/05/2021                HGBA1C                   6.2 (H)             01/25/2021           Lab Results      Component                Value               Date                      LDLCALC                  116 (H)             08/05/2021                CREATININE               1.18                08/06/2021           Lab Results      Component                Value               Date                      VD25OH                   43.8                08/05/2021            BP Readings from Last 3 Encounters: 10/26/21 : 121/70 10/04/21 : 117/79 09/29/21 : 126/76

## 2021-11-16 NOTE — Telephone Encounter (Signed)
Pt last seen by Dr. Ukleja.  

## 2021-11-17 NOTE — Telephone Encounter (Signed)
Refill request sent

## 2021-11-29 ENCOUNTER — Ambulatory Visit (INDEPENDENT_AMBULATORY_CARE_PROVIDER_SITE_OTHER): Payer: Managed Care, Other (non HMO) | Admitting: Family Medicine

## 2021-11-29 ENCOUNTER — Encounter (INDEPENDENT_AMBULATORY_CARE_PROVIDER_SITE_OTHER): Payer: Self-pay | Admitting: Family Medicine

## 2021-11-29 ENCOUNTER — Other Ambulatory Visit: Payer: Self-pay

## 2021-11-29 VITALS — BP 119/74 | HR 72 | Temp 97.7°F | Ht 70.0 in | Wt 295.0 lb

## 2021-11-29 DIAGNOSIS — R7303 Prediabetes: Secondary | ICD-10-CM

## 2021-11-29 DIAGNOSIS — I10 Essential (primary) hypertension: Secondary | ICD-10-CM | POA: Diagnosis not present

## 2021-11-29 DIAGNOSIS — Z6841 Body Mass Index (BMI) 40.0 and over, adult: Secondary | ICD-10-CM | POA: Diagnosis not present

## 2021-11-29 DIAGNOSIS — E669 Obesity, unspecified: Secondary | ICD-10-CM

## 2021-11-29 NOTE — Progress Notes (Signed)
Chief Complaint:   OBESITY Calvin Hancock is here to discuss his progress with his obesity treatment plan along with follow-up of his obesity related diagnoses. Calvin Hancock is on the Category 3 Plan and states he is following his eating plan approximately 75% of the time. [name] states he is walking some for exercise.  Today's visit was #: 6 Starting weight: 316 lbs Starting date: 08/05/2021 Today's weight: 295 lbs Today's date: 11/29/2021 Total lbs lost to date: 21 lbs Total lbs lost since last in-office visit: 0  Interim History: Calvin Hancock was on vacation last week in [place].  He enjoyed his time away.  Still waiting for MRI to be scheduled for his knee (not scheduled to return to work until February 5th).  Just restocked groceries yesterday.  He has no upcoming obstacles.  Subjective:   1. Essential hypertension BP well controlled.  Denies chest pain, chest pressure, or headache.  He is on Norvasc, HCTZ, and Benicar.  2. Prediabetes His last A1c was 5.9, insulin level 12.5.  He is on daily metformin.  Assessment/Plan:   1. Essential hypertension Calvin Hancock is working on healthy weight loss and exercise to improve blood pressure control. We will watch for signs of hypotension as he continues his lifestyle modifications.  Continue current medications.  No refill needed.  2. Prediabetes Calvin Hancock will continue to work on weight loss, exercise, and decreasing simple carbohydrates to help decrease the risk of diabetes.  Check labs in February/March.  3. Obesity with current BMI of 42.4  Calvin Hancock is currently in the action stage of change. As such, his goal is to continue with weight loss efforts. He has agreed to the Category 3 Plan.   Exercise goals: No exercise has been prescribed at this time.  Behavioral modification strategies: increasing lean protein intake, meal planning and cooking strategies, and keeping healthy foods in the home.  Calvin Hancock has agreed to follow-up with our clinic in 2-3  weeks. He was informed of the importance of frequent follow-up visits to maximize his success with intensive lifestyle modifications for his multiple health conditions.   Objective:   Blood pressure 119/74, pulse 72, temperature 97.7 F (36.5 C), height 5\' 10"  (1.778 m), weight 295 lb (133.8 kg), SpO2 98 %. Body mass index is 42.33 kg/m.  General: Cooperative, alert, well developed, in no acute distress. HEENT: Conjunctivae and lids unremarkable. Cardiovascular: Regular rhythm.  Lungs: Normal work of breathing. Neurologic: No focal deficits.   Lab Results  Component Value Date   CREATININE 1.18 08/06/2021   BUN 15 08/06/2021   NA 139 08/06/2021   K 3.9 08/06/2021   CL 100 08/06/2021   CO2 31 08/06/2021   Lab Results  Component Value Date   ALT 31 08/06/2021   AST 29 08/06/2021   ALKPHOS 34 (L) 08/06/2021   BILITOT 0.6 08/06/2021   Lab Results  Component Value Date   HGBA1C 5.9 (H) 08/05/2021   HGBA1C 6.2 (H) 01/25/2021   Lab Results  Component Value Date   INSULIN 12.5 08/05/2021   Lab Results  Component Value Date   TSH 1.090 08/05/2021   Lab Results  Component Value Date   CHOL 179 08/05/2021   HDL 52 08/05/2021   LDLCALC 116 (H) 08/05/2021   TRIG 55 08/05/2021   CHOLHDL 3.8 01/25/2021   Lab Results  Component Value Date   VD25OH 43.8 08/05/2021   Lab Results  Component Value Date   WBC 5.4 08/06/2021   HGB 15.2 08/06/2021   HCT  46.7 08/06/2021   MCV 88.4 08/06/2021   PLT 230 08/06/2021   Attestation Statements:   Reviewed by clinician on day of visit: allergies, medications, problem list, medical history, surgical history, family history, social history, and previous encounter notes.  I, Water quality scientist, CMA, am acting as transcriptionist for Coralie Common, MD. I have reviewed the above documentation for accuracy and completeness, and I agree with the above. - Coralie Common, MD

## 2021-12-01 ENCOUNTER — Ambulatory Visit: Payer: Managed Care, Other (non HMO) | Admitting: Cardiovascular Disease

## 2021-12-01 ENCOUNTER — Other Ambulatory Visit: Payer: Self-pay

## 2021-12-01 ENCOUNTER — Encounter: Payer: Self-pay | Admitting: Cardiovascular Disease

## 2021-12-01 VITALS — BP 119/72 | HR 71 | Ht 70.0 in | Wt 299.6 lb

## 2021-12-01 DIAGNOSIS — I319 Disease of pericardium, unspecified: Secondary | ICD-10-CM | POA: Diagnosis not present

## 2021-12-01 DIAGNOSIS — E782 Mixed hyperlipidemia: Secondary | ICD-10-CM | POA: Diagnosis not present

## 2021-12-01 DIAGNOSIS — I5032 Chronic diastolic (congestive) heart failure: Secondary | ICD-10-CM

## 2021-12-01 DIAGNOSIS — I10 Essential (primary) hypertension: Secondary | ICD-10-CM | POA: Diagnosis not present

## 2021-12-01 NOTE — Patient Instructions (Signed)
Medication Instructions:  The current medical regimen is effective;  continue present plan and medications.  *If you need a refill on your cardiac medications before your next appointment, please call your pharmacy*   Follow-Up: At [facility], you and your health needs are our priority.  As part of our continuing mission to provide you with exceptional heart care, we have created designated Provider Care Teams.  These Care Teams include your primary Cardiologist (physician) and Advanced Practice Providers (APPs -  Physician Assistants and Nurse Practitioners) who all work together to provide you with the care you need, when you need it.  We recommend signing up for the patient portal called "MyChart".  Sign up information is provided on this After Visit Summary.  MyChart is used to connect with patients for Virtual Visits (Telemedicine).  Patients are able to view lab/test results, encounter notes, upcoming appointments, etc.  Non-urgent messages can be sent to your provider as well.   To learn more about what you can do with MyChart, go to [email or web address].    Your next appointment:   12 month(s)  The format for your next appointment:   In Person  Provider:   Marjie Skiff, PA-C or Azalee Course, PA-C or  Reatha Harps, MD 1}

## 2021-12-01 NOTE — Progress Notes (Signed)
Cardiology Office Note:   Date:  12/01/2021  NAME:  Calvin Hancock    MRN: [id number] DOB:  01/09/1966   PCP:  Shirline Frees, MD  Cardiologist:  Evalina Field, MD  Electrophysiologist:  None   Referring MD: Shirline Frees, MD   Chief Complaint  Patient presents with   Follow-up         History of Present Illness:   Calvin Hancock is a 56 y.o. male with a hx of diastolic CHF, myopericarditis, obesity, OSA who presents for follow-up.  He reports he is doing well.  Denies any significant chest pain or trouble breathing.  He reports his wife tells him he can be short of breath when sitting.  He looks quite comfortable today.  Cardiovascular examination is normal.  Weights are stable.  299 pounds.  He actually has lost 15 pounds since last year.  He reports he is suffering from knee pain.  He is working with orthopedic surgery.  He may need knee replacement surgery.  Not exercising.  Out of work to her knees.  BP 119/72.  Cardiovascular examination again is normal.  He overall seems to be well-healed from his myocarditis episode.  Without major complaints today.  Problem List 1. Obesity -BMI 45 2. HTN 3. OSA 4. Myopericarditis 01/25/2021 -LHC normal -CMR with myopericarditis 5. HLD -T chol 179, HDL 52, LDL 116, triglycerides 55 -A1c 5.9 6.  HFpEF -LVEDP 30 mmHG 01/25/2021  Past Medical History: Past Medical History:  Diagnosis Date   ED (erectile dysfunction)    Elevated troponin level not due myocardial infarction, +chest pain 01/23/2021   Gout    Hypertension    Joint pain    Migraine    Myocarditis ([place])    Prediabetes    Sleep apnea     Past Surgical History: Past Surgical History:  Procedure Laterality Date   LEFT HEART CATH AND CORONARY ANGIOGRAPHY N/A 01/25/2021   Procedure: LEFT HEART CATH AND CORONARY ANGIOGRAPHY;  Surgeon: Nelva Bush, MD;  Location: [facility];  Service: Cardiovascular;  Laterality: N/A;   PATELLA FRACTURE SURGERY       Current Medications: Current Meds  Medication Sig   ALLOPURINOL PO Take 200 mg by mouth daily.   amLODipine (NORVASC) 10 MG tablet Take 0.5 tablets (5 mg total) by mouth in the morning.   Cholecalciferol (VITAMIN D3) 50 MCG (2000 UT) CAPS Take by mouth.   etodolac (LODINE) 400 MG tablet Take 400 mg by mouth 2 (two) times daily.   hydrochlorothiazide (HYDRODIURIL) 25 MG tablet Take 1 tablet (25 mg total) by mouth daily.   HYDROcodone-acetaminophen (NORCO/VICODIN) 5-325 MG tablet Take 1 tablet by mouth every 6 (six) hours as needed for moderate pain.   ibuprofen (ADVIL) 800 MG tablet Take 1 tablet (800 mg total) by mouth 3 (three) times daily.   metFORMIN (GLUCOPHAGE) 500 MG tablet TAKE 1 TABLET BY MOUTH 2 TIMES DAILY WITH A MEAL.   Multiple Vitamin (MULTIVITAMIN WITH MINERALS) TABS tablet Take 1 tablet by mouth in the morning.   olmesartan (BENICAR) 40 MG tablet Take 20 mg by mouth every morning.   pantoprazole (PROTONIX) 40 MG tablet Take 1 tablet (40 mg total) by mouth daily.     Allergies:    Patient has no known allergies.   Social History: Social History   Socioeconomic History   Marital status: Single    Spouse name: Not on file   Number of children: Not on file   Years  of education: Not on file   Highest education level: Not on file  Occupational History   Occupation: Public affairs consultant  Tobacco Use   Smoking status: Never   Smokeless tobacco: Never  Substance and Sexual Activity   Alcohol use: Yes    Alcohol/week: 0.0 standard drinks    Comment: social   Drug use: No   Sexual activity: Not on file  Other Topics Concern   Not on file  Social History Narrative   ** Merged History Encounter **       Social Determinants of Health   Financial Resource Strain: Not on file  Food Insecurity: Not on file  Transportation Needs: Not on file  Physical Activity: Not on file  Stress: Not on file  Social Connections: Not on file     Family History: The patient's  family history includes Alcoholism in his father; Diabetes in his father; Heart disease in his father and mother; Hyperlipidemia in his father; Hypertension in his father and mother; Sleep apnea in his father; Sudden death in his father.  ROS:   All other ROS reviewed and negative. Pertinent positives noted in the HPI.     EKGs/Labs/Other Studies Reviewed:   The following studies were personally reviewed by me today:  [facility] 02/04/2021 Impression: 1. Brief non-sustained ventricular tachycardia detected (1 episode; 8.7 second duration).  2. Occasional PVCs (1.3% burden).   LHC 01/25/2021 Conclusions: No angiographically significant coronary artery disease. Low normal left ventricular contraction with moderately elevated filling pressure (LVEDP 25-30 mmHg).  CMR 01/27/2021 IMPRESSION: 1.  Normal left ventricular function, LVEF 55%.   2. Parametric mapping and gadolinium enhancement pattern consistent with myocarditis; modified [facility] Criteria met.   3. Increase in pericardial thickening and enhancement consistent with pericarditis.   4.  Study consistent with myopericarditis.   Rudean Haskell MD  Recent Labs: 01/26/2021: Magnesium 2.0 08/05/2021: TSH 1.090 08/06/2021: ALT 31; B Natriuretic Peptide 5.1; BUN 15; Creatinine, Ser 1.18; Hemoglobin 15.2; Platelets 230; Potassium 3.9; Sodium 139   Recent Lipid Panel    Component Value Date/Time   CHOL 179 08/05/2021 0850   TRIG 55 08/05/2021 0850   HDL 52 08/05/2021 0850   CHOLHDL 3.8 01/25/2021 0324   VLDL 30 01/25/2021 0324   LDLCALC 116 (H) 08/05/2021 0850    Physical Exam:   VS:  BP 119/72    Pulse 71    Ht 5' 10"  (1.778 m)    Wt 299 lb 9.6 oz (135.9 kg)    SpO2 98%    BMI 42.99 kg/m    Wt Readings from Last 3 Encounters:  12/01/21 299 lb 9.6 oz (135.9 kg)  11/29/21 295 lb (133.8 kg)  10/26/21 289 lb (131.1 kg)    General: Well nourished, well developed, in no acute distress Head: Atraumatic, normal size  Eyes:  PEERLA, EOMI  Neck: Supple, no JVD Endocrine: No thryomegaly Cardiac: Normal S1, S2; RRR; no murmurs, rubs, or gallops Lungs: Clear to auscultation bilaterally, no wheezing, rhonchi or rales  Abd: Soft, nontender, no hepatomegaly  Ext: No edema, pulses 2+ Musculoskeletal: No deformities, BUE and BLE strength normal and equal Skin: Warm and dry, no rashes   Neuro: Alert and oriented to person, place, time, and situation, CNII-XII grossly intact, no focal deficits  Psych: Normal mood and affect   ASSESSMENT:   Calvin Hancock is a 56 y.o. male who presents for the following: 1. Chronic diastolic heart failure ([place])   2. Myopericarditis   3. Primary hypertension  4. Mixed hyperlipidemia   5. Obesity, morbid, BMI 40.0-49.9 ([place])    PLAN:   1. Chronic diastolic heart failure ([place]) -Denies any major symptoms of congestive heart failure.  Suspect this is largely obesity related.  Euvolemic on exam.  I have recommended adequate exercise as well as blood pressure control.  He denies any major symptoms today.  He is suffering from arthritis.  I believe this is the bigger issue.  For now we will continue with current blood pressure regimen.  He has been advised to watch the salt.  2. Myopericarditis -Diagnosed with myopericarditis in March 2022.  Symptoms have resolved.  Monitor benign.  No significant symptoms today.  He has been cleared back to activity.  We discussed that this was a benign entity and he has come through this quite well.  There is no long-term sequelae of his prior myocarditis.  His inflammatory markers were negative.  Would recommend to continue with adequate exercise as he sees fit.  3. Primary hypertension -Well-controlled today.  Continue amlodipine 5 mg daily, HCTZ 25 mg daily, olmesartan 40 mg daily.  4. Mixed hyperlipidemia -No evidence of CAD.  Can continue with diet and exercise for now.  5. Obesity, morbid, BMI 40.0-49.9 ([place]) -Weight loss  recommended.  Disposition: Return in about 1 year (around 12/01/2022).  Medication Adjustments/Labs and Tests Ordered: Current medicines are reviewed at length with the patient today.  Concerns regarding medicines are outlined above.  No orders of the defined types were placed in this encounter.  No orders of the defined types were placed in this encounter.   Patient Instructions  Medication Instructions:  The current medical regimen is effective;  continue present plan and medications.  *If you need a refill on your cardiac medications before your next appointment, please call your pharmacy*   Follow-Up: At [facility], you and your health needs are our priority.  As part of our continuing mission to provide you with exceptional heart care, we have created designated Provider Care Teams.  These Care Teams include your primary Cardiologist (physician) and Advanced Practice Providers (APPs -  Physician Assistants and Nurse Practitioners) who all work together to provide you with the care you need, when you need it.  We recommend signing up for the patient portal called "MyChart".  Sign up information is provided on this After Visit Summary.  MyChart is used to connect with patients for Virtual Visits (Telemedicine).  Patients are able to view lab/test results, encounter notes, upcoming appointments, etc.  Non-urgent messages can be sent to your provider as well.   To learn more about what you can do with MyChart, go to [email or web address].    Your next appointment:   12 month(s)  The format for your next appointment:   In Person  Provider:   Sande Rives, PA-C or Almyra Deforest, PA-C or  Evalina Field, MD 1}       Time Spent with Patient: I have spent a total of 25 minutes with patient reviewing hospital notes, telemetry, EKGs, labs and examining the patient as well as establishing an assessment and plan that was discussed with the patient.  > 50% of time was spent in direct  patient care.  Signed, Addison Naegeli. Audie Box, MD, [place]  [address] [place], [place] [address] [phone]  12/01/2021 9:39 AM

## 2021-12-10 ENCOUNTER — Other Ambulatory Visit (INDEPENDENT_AMBULATORY_CARE_PROVIDER_SITE_OTHER): Payer: Self-pay | Admitting: Family Medicine

## 2021-12-10 DIAGNOSIS — R7303 Prediabetes: Secondary | ICD-10-CM

## 2021-12-13 ENCOUNTER — Encounter (INDEPENDENT_AMBULATORY_CARE_PROVIDER_SITE_OTHER): Payer: Self-pay | Admitting: Family Medicine

## 2021-12-13 ENCOUNTER — Ambulatory Visit (INDEPENDENT_AMBULATORY_CARE_PROVIDER_SITE_OTHER): Payer: Managed Care, Other (non HMO) | Admitting: Family Medicine

## 2021-12-13 ENCOUNTER — Other Ambulatory Visit: Payer: Self-pay

## 2021-12-13 VITALS — BP 100/61 | HR 80 | Temp 97.7°F | Ht 70.0 in | Wt 286.0 lb

## 2021-12-13 DIAGNOSIS — Z6841 Body Mass Index (BMI) 40.0 and over, adult: Secondary | ICD-10-CM | POA: Diagnosis not present

## 2021-12-13 DIAGNOSIS — R7303 Prediabetes: Secondary | ICD-10-CM

## 2021-12-13 DIAGNOSIS — E669 Obesity, unspecified: Secondary | ICD-10-CM | POA: Diagnosis not present

## 2021-12-13 DIAGNOSIS — I10 Essential (primary) hypertension: Secondary | ICD-10-CM | POA: Diagnosis not present

## 2021-12-13 MED ORDER — METFORMIN HCL 500 MG PO TABS: 500.0000 mg | ORAL_TABLET | Freq: Every day | ORAL | 0 refills | Status: DC

## 2021-12-13 NOTE — Progress Notes (Signed)
Chief Complaint:   OBESITY Calvin Hancock is here to discuss his progress with his obesity treatment plan along with follow-up of his obesity related diagnoses. Calvin Hancock is on the Category 3 Plan and states he is following his eating plan approximately 100% of the time. Calvin Hancock states he is not currently exercising.  Today's visit was #: 7 Starting weight: 316 lbs Starting date: 08/05/2021 Today's weight: 286 lbs Today's date: 12/13/2021 Total lbs lost to date: 30 Total lbs lost since last in-office visit: 9  Interim History: Pt got an MRI done yesterday for his knees. He has pulled himself back on plan over the last few weeks. Pt would like to start going to the gym again. He may be getting bored with food choices (particularly sandwiches at lunch). Pt has no upcoming travel or celebrations.  Subjective:   1. Essential hypertension BP very well controlled. Pt has no dizziness or lightheadedness on 1/2 dose Olmesartan, Norvasc, and HCTZ.  2. Pre-diabetes Pt's last A1c was 5.9. Pt is taking Metformin daily.  Assessment/Plan:   1. Essential hypertension Calvin Hancock is working on healthy weight loss and exercise to improve blood pressure control. We will watch for signs of hypotension as he continues his lifestyle modifications. Continue current meds with no change in dose. Pt is to MyChart if any symptoms arise.  2. Pre-diabetes Calvin Hancock will continue to work on weight loss, exercise, and decreasing simple carbohydrates to help decrease the risk of diabetes. Continue current dosing.  Refill- metFORMIN (GLUCOPHAGE) 500 MG tablet; Take 1 tablet (500 mg total) by mouth daily with breakfast.  Dispense: 60 tablet; Refill: 0  3. Obesity with current BMI of 41.1 Calvin Hancock is currently in the action stage of change. As such, his goal is to continue with weight loss efforts. He has agreed to the Category 3 Plan and keeping a food journal and adhering to recommended goals of 350-500 calories and 35+ grams  protein with lunch.   Exercise goals: All adults should avoid inactivity. Some physical activity is better than none, and adults who participate in any amount of physical activity gain some health benefits.  Behavioral modification strategies: increasing lean protein intake, meal planning and cooking strategies, and keeping healthy foods in the home.  Calvin Hancock has agreed to follow-up with our clinic in 3 weeks. He was informed of the importance of frequent follow-up visits to maximize his success with intensive lifestyle modifications for his multiple health conditions.   Objective:   Blood pressure 100/61, pulse 80, temperature 97.7 F (36.5 C), height 5\' 10"  (1.778 m), weight 286 lb (129.7 kg), SpO2 95 %. Body mass index is 41.04 kg/m.  General: Cooperative, alert, well developed, in no acute distress. HEENT: Conjunctivae and lids unremarkable. Cardiovascular: Regular rhythm.  Lungs: Normal work of breathing. Neurologic: No focal deficits.   Lab Results  Component Value Date   CREATININE 1.18 08/06/2021   BUN 15 08/06/2021   NA 139 08/06/2021   K 3.9 08/06/2021   CL 100 08/06/2021   CO2 31 08/06/2021   Lab Results  Component Value Date   ALT 31 08/06/2021   AST 29 08/06/2021   ALKPHOS 34 (L) 08/06/2021   BILITOT 0.6 08/06/2021   Lab Results  Component Value Date   HGBA1C 5.9 (H) 08/05/2021   HGBA1C 6.2 (H) 01/25/2021   Lab Results  Component Value Date   INSULIN 12.5 08/05/2021   Lab Results  Component Value Date   TSH 1.090 08/05/2021   Lab Results  Component Value Date   CHOL 179 08/05/2021   HDL 52 08/05/2021   LDLCALC 116 (H) 08/05/2021   TRIG 55 08/05/2021   CHOLHDL 3.8 01/25/2021   Lab Results  Component Value Date   VD25OH 43.8 08/05/2021   Lab Results  Component Value Date   WBC 5.4 08/06/2021   HGB 15.2 08/06/2021   HCT 46.7 08/06/2021   MCV 88.4 08/06/2021   PLT 230 08/06/2021    Attestation Statements:   Reviewed by clinician on day  of visit: allergies, medications, problem list, medical history, surgical history, family history, social history, and previous encounter notes.  Edmund Hilda, CMA, am acting as transcriptionist for Reuben Likes, MD.   I have reviewed the above documentation for accuracy and completeness, and I agree with the above. - Reuben Likes, MD

## 2022-01-03 ENCOUNTER — Other Ambulatory Visit: Payer: Self-pay

## 2022-01-03 ENCOUNTER — Ambulatory Visit (INDEPENDENT_AMBULATORY_CARE_PROVIDER_SITE_OTHER): Payer: Managed Care, Other (non HMO) | Admitting: Family Medicine

## 2022-01-03 ENCOUNTER — Encounter (INDEPENDENT_AMBULATORY_CARE_PROVIDER_SITE_OTHER): Payer: Self-pay | Admitting: Family Medicine

## 2022-01-03 VITALS — BP 105/70 | HR 71 | Temp 97.9°F | Ht 70.0 in | Wt 290.0 lb

## 2022-01-03 DIAGNOSIS — Z6841 Body Mass Index (BMI) 40.0 and over, adult: Secondary | ICD-10-CM

## 2022-01-03 DIAGNOSIS — E669 Obesity, unspecified: Secondary | ICD-10-CM | POA: Diagnosis not present

## 2022-01-03 DIAGNOSIS — I10 Essential (primary) hypertension: Secondary | ICD-10-CM

## 2022-01-03 DIAGNOSIS — R7303 Prediabetes: Secondary | ICD-10-CM | POA: Diagnosis not present

## 2022-01-03 DIAGNOSIS — Z9189 Other specified personal risk factors, not elsewhere classified: Secondary | ICD-10-CM

## 2022-01-03 MED ORDER — METFORMIN HCL 500 MG PO TABS: 500.0000 mg | ORAL_TABLET | Freq: Every day | ORAL | 0 refills | Status: DC

## 2022-01-03 MED ORDER — AMLODIPINE BESYLATE 5 MG PO TABS: 5.0000 mg | ORAL_TABLET | Freq: Every morning | ORAL | 0 refills | Status: DC

## 2022-01-03 NOTE — Progress Notes (Signed)
Chief Complaint:   OBESITY Calvin Hancock is here to discuss his progress with his obesity treatment plan along with follow-up of his obesity related diagnoses. Calvin Hancock is on the Category 3 Plan and keeping a food journal and adhering to recommended goals of 350-500 calories and 95+ grams protein and states he is following his eating plan approximately 90% of the time. Calvin Hancock states he is doing PT 60 minutes 2-3 times per week.  Today's visit was #: 8 Starting weight: 316 lbs Starting date: 08/05/2021 Today's weight: 290 lbs Today's date: 01/03/2022 Total lbs lost to date: 26 Total lbs lost since last in-office visit: 0  Interim History: Pt has an appt with orthopedic surgeon in 4 days ago. He likely will need surgery in the upcoming few months. May be looking for alternative option to be able to sit. Pt thinks he is getting all protein in. He is weighing protein. For snacks, he is doing popcorn- only 200 calories.  Subjective:   1. Essential hypertension BP very well controlled. Pt hasn't been having amlodipine. Calvin Hancock denies chest pain/chest pressure/headache.  2. Pre-diabetes Calvin Hancock's last A1c was 5.9 with an insulin level of 12.5. He is on Metformin with no GI side effects.  3. At risk for complication associated with hypotension The patient is at a higher than average risk of hypotension due to numerous medications.  Assessment/Plan:   1. Essential hypertension Calvin Hancock is working on healthy weight loss and exercise to improve blood pressure control. We will watch for signs of hypotension as he continues his lifestyle modifications. Calvin Hancock will decrease to Norvasc 5 mg daily.   Decrease- amLODipine (NORVASC) 5 MG tablet; Take 1 tablet (5 mg total) by mouth in the morning.  Dispense: 30 tablet; Refill: 0  2. Pre-diabetes Calvin Hancock will continue to work on weight loss, exercise, and decreasing simple carbohydrates to help decrease the risk of diabetes.   Refill- metFORMIN (GLUCOPHAGE)  500 MG tablet; Take 1 tablet (500 mg total) by mouth daily with breakfast.  Dispense: 30 tablet; Refill: 0  3. At risk for complication associated with hypotension Calvin Hancock was given approximately 15 minutes of education and counseling today to help avoid hypotension. We discussed risks of hypotension with weight loss and signs of hypotension such as feeling lightheaded or unsteady.  Repetitive spaced learning was employed today to elicit superior memory formation and behavioral change.   4. Obesity with current BMI of 41.7 Calvin Hancock is currently in the action stage of change. As such, his goal is to continue with weight loss efforts. He has agreed to the Category 4 Plan.   Exercise goals:  As is  Behavioral modification strategies: increasing lean protein intake, meal planning and cooking strategies, keeping healthy foods in the home, and planning for success.  Calvin Hancock has agreed to follow-up with our clinic in 3-4 weeks. He was informed of the importance of frequent follow-up visits to maximize his success with intensive lifestyle modifications for his multiple health conditions.   Objective:   Blood pressure 105/70, pulse 71, temperature (!) 79.9 F (26.6 C), height 5\' 10"  (1.778 m), weight 290 lb (131.5 kg), SpO2 98 %. Body mass index is 41.61 kg/m.  General: Cooperative, alert, well developed, in no acute distress. HEENT: Conjunctivae and lids unremarkable. Cardiovascular: Regular rhythm.  Lungs: Normal work of breathing. Neurologic: No focal deficits.   Lab Results  Component Value Date   CREATININE 1.18 08/06/2021   BUN 15 08/06/2021   NA 139 08/06/2021   K 3.9  08/06/2021   CL 100 08/06/2021   CO2 31 08/06/2021   Lab Results  Component Value Date   ALT 31 08/06/2021   AST 29 08/06/2021   ALKPHOS 34 (L) 08/06/2021   BILITOT 0.6 08/06/2021   Lab Results  Component Value Date   HGBA1C 5.9 (H) 08/05/2021   HGBA1C 6.2 (H) 01/25/2021   Lab Results  Component Value Date    INSULIN 12.5 08/05/2021   Lab Results  Component Value Date   TSH 1.090 08/05/2021   Lab Results  Component Value Date   CHOL 179 08/05/2021   HDL 52 08/05/2021   LDLCALC 116 (H) 08/05/2021   TRIG 55 08/05/2021   CHOLHDL 3.8 01/25/2021   Lab Results  Component Value Date   VD25OH 43.8 08/05/2021   Lab Results  Component Value Date   WBC 5.4 08/06/2021   HGB 15.2 08/06/2021   HCT 46.7 08/06/2021   MCV 88.4 08/06/2021   PLT 230 08/06/2021    Attestation Statements:   Reviewed by clinician on day of visit: allergies, medications, problem list, medical history, surgical history, family history, social history, and previous encounter notes.  Edmund Hilda, CMA, am acting as transcriptionist for Reuben Likes, MD.  I have reviewed the above documentation for accuracy and completeness, and I agree with the above. - Reuben Likes, MD

## 2022-01-21 ENCOUNTER — Other Ambulatory Visit: Payer: Self-pay | Admitting: Cardiovascular Disease

## 2022-01-25 ENCOUNTER — Other Ambulatory Visit (INDEPENDENT_AMBULATORY_CARE_PROVIDER_SITE_OTHER): Payer: Self-pay | Admitting: Family Medicine

## 2022-01-25 DIAGNOSIS — R7303 Prediabetes: Secondary | ICD-10-CM

## 2022-01-25 DIAGNOSIS — I10 Essential (primary) hypertension: Secondary | ICD-10-CM

## 2022-01-27 ENCOUNTER — Other Ambulatory Visit: Payer: Self-pay

## 2022-01-27 ENCOUNTER — Ambulatory Visit (INDEPENDENT_AMBULATORY_CARE_PROVIDER_SITE_OTHER): Payer: Managed Care, Other (non HMO) | Admitting: Family Medicine

## 2022-01-27 ENCOUNTER — Encounter (INDEPENDENT_AMBULATORY_CARE_PROVIDER_SITE_OTHER): Payer: Self-pay | Admitting: Family Medicine

## 2022-01-27 VITALS — BP 137/76 | HR 73 | Temp 97.9°F | Ht 70.0 in | Wt 295.0 lb

## 2022-01-27 DIAGNOSIS — I10 Essential (primary) hypertension: Secondary | ICD-10-CM | POA: Diagnosis not present

## 2022-01-27 DIAGNOSIS — Z6841 Body Mass Index (BMI) 40.0 and over, adult: Secondary | ICD-10-CM

## 2022-01-27 DIAGNOSIS — E669 Obesity, unspecified: Secondary | ICD-10-CM | POA: Diagnosis not present

## 2022-01-27 DIAGNOSIS — R7303 Prediabetes: Secondary | ICD-10-CM

## 2022-02-01 NOTE — Progress Notes (Signed)
? ? ? ?Chief Complaint:  ? ?OBESITY ?Calvin Hancock is here to discuss his progress with his obesity treatment plan along with follow-up of his obesity related diagnoses. Calvin Hancock is on the Category 4 Plan and states he is following his eating plan approximately 95% of the time. Calvin Hancock states he is doing 0 minutes 0 times per week. ? ?Today's visit was #: 9 ?Starting weight: 316 lbs ?Starting date: 08/05/2021 ?Today's weight: 295 lbs ?Today's date: 01/27/2022 ?Total lbs lost to date: 21 ?Total lbs lost since last in-office visit: 0 ? ?Interim History: Calvin Hancock is feeling frustrated with weight gain. He is doing eggs with bread and [place] sausage and milk for breakfast. Lunch is a sandwich and 3 cups of skinny pop. Dinner is 10 oz of meat and then vegetables. Ice cream or yogurt bar. Not hungry. He is wondering about metformin usage. He is drinking quite a bit of water daily. He is making sure he is getting all snack calories in. ? ?Subjective:  ? ?1. Essential hypertension ?Calvin Hancock's blood pressure is controlled today. He denies chest pain, chest pressure, or headache. ? ?2. Pre-diabetes ?Calvin Hancock's last A1c was 5.9 and insulin 12.5 at his first appointment. He is on metformin daily, and his recent A1c has improved at 5.6. ? ?Assessment/Plan:  ? ?1. Essential hypertension ?Calvin Hancock will continue his medications, and we will refill amlodipine 5 mg PO daily #30 for 1 month. ? ?2. Pre-diabetes ?Calvin Hancock agreed to discontinue metformin, and we will follow up at his next appointment. ? ?3. Obesity with current BMI of 42.3 ?Calvin Hancock is currently in the action stage of change. As such, his goal is to continue with weight loss efforts. He has agreed to the Category 4 Plan.  ? ?Exercise goals: No exercise has been prescribed at this time. ? ?Behavioral modification strategies: increasing lean protein intake, meal planning and cooking strategies, keeping healthy foods in the home, and planning for success. ? ?Calvin Hancock has agreed to follow-up with  our clinic in 3 weeks. He was informed of the importance of frequent follow-up visits to maximize his success with intensive lifestyle modifications for his multiple health conditions.  ? ?Objective:  ? ?Blood pressure 137/76, pulse 73, temperature 97.9 ?F (36.6 ?C), height 5\' 10"  (1.778 m), weight 295 lb (133.8 kg), SpO2 98 %. ?Body mass index is 42.33 kg/m?. ? ?General: Cooperative, alert, well developed, in no acute distress. ?HEENT: Conjunctivae and lids unremarkable. ?Cardiovascular: Regular rhythm.  ?Lungs: Normal work of breathing. ?Neurologic: No focal deficits.  ? ?Lab Results  ?Component Value Date  ? CREATININE 1.18 08/06/2021  ? BUN 15 08/06/2021  ? NA 139 08/06/2021  ? K 3.9 08/06/2021  ? CL 100 08/06/2021  ? CO2 31 08/06/2021  ? ?Lab Results  ?Component Value Date  ? ALT 31 08/06/2021  ? AST 29 08/06/2021  ? ALKPHOS 34 (L) 08/06/2021  ? BILITOT 0.6 08/06/2021  ? ?Lab Results  ?Component Value Date  ? HGBA1C 5.9 (H) 08/05/2021  ? HGBA1C 6.2 (H) 01/25/2021  ? ?Lab Results  ?Component Value Date  ? INSULIN 12.5 08/05/2021  ? ?Lab Results  ?Component Value Date  ? TSH 1.090 08/05/2021  ? ?Lab Results  ?Component Value Date  ? CHOL 179 08/05/2021  ? HDL 52 08/05/2021  ? LDLCALC 116 (H) 08/05/2021  ? TRIG 55 08/05/2021  ? CHOLHDL 3.8 01/25/2021  ? ?Lab Results  ?Component Value Date  ? VD25OH 43.8 08/05/2021  ? ?Lab Results  ?Component Value Date  ? WBC 5.4  08/06/2021  ? HGB 15.2 08/06/2021  ? HCT 46.7 08/06/2021  ? MCV 88.4 08/06/2021  ? PLT 230 08/06/2021  ? ?No results found for: IRON, TIBC, FERRITIN ? ?Attestation Statements:  ? ?Reviewed by clinician on day of visit: allergies, medications, problem list, medical history, surgical history, family history, social history, and previous encounter notes. ? ? ?I, Trixie Dredge, am acting as transcriptionist for Coralie Common, MD. ? ?I have reviewed the above documentation for accuracy and completeness, and I agree with the above. Coralie Common,  MD ? ? ?

## 2022-02-03 MED ORDER — AMLODIPINE BESYLATE 5 MG PO TABS: 5.0000 mg | ORAL_TABLET | Freq: Every morning | ORAL | 0 refills | Status: DC

## 2022-02-16 ENCOUNTER — Ambulatory Visit (INDEPENDENT_AMBULATORY_CARE_PROVIDER_SITE_OTHER): Payer: Managed Care, Other (non HMO) | Admitting: Family Medicine

## 2022-02-16 ENCOUNTER — Ambulatory Visit (INDEPENDENT_AMBULATORY_CARE_PROVIDER_SITE_OTHER): Payer: Managed Care, Other (non HMO) | Admitting: Nurse Practitioner

## 2022-02-16 ENCOUNTER — Encounter (INDEPENDENT_AMBULATORY_CARE_PROVIDER_SITE_OTHER): Payer: Self-pay | Admitting: Nurse Practitioner

## 2022-02-16 VITALS — BP 128/79 | HR 71 | Temp 97.3°F | Ht 70.0 in | Wt 303.0 lb

## 2022-02-16 DIAGNOSIS — I10 Essential (primary) hypertension: Secondary | ICD-10-CM

## 2022-02-16 DIAGNOSIS — E669 Obesity, unspecified: Secondary | ICD-10-CM | POA: Diagnosis not present

## 2022-02-16 DIAGNOSIS — Z6841 Body Mass Index (BMI) 40.0 and over, adult: Secondary | ICD-10-CM | POA: Diagnosis not present

## 2022-02-16 DIAGNOSIS — G4733 Obstructive sleep apnea (adult) (pediatric): Secondary | ICD-10-CM

## 2022-02-17 ENCOUNTER — Encounter (INDEPENDENT_AMBULATORY_CARE_PROVIDER_SITE_OTHER): Payer: Self-pay | Admitting: Family Medicine

## 2022-02-17 ENCOUNTER — Encounter: Payer: Self-pay | Admitting: Cardiovascular Disease

## 2022-02-17 DIAGNOSIS — I5032 Chronic diastolic (congestive) heart failure: Secondary | ICD-10-CM

## 2022-02-17 MED ORDER — FUROSEMIDE 20 MG PO TABS: 20.0000 mg | ORAL_TABLET | Freq: Every day | ORAL | 3 refills | Status: DC

## 2022-02-17 NOTE — Progress Notes (Signed)
? ? ? ?Chief Complaint:  ? ?OBESITY ?Calvin Hancock is here to discuss his progress with his obesity treatment plan along with follow-up of his obesity related diagnoses. Calvin Hancock is on the Category 4 Plan and states he is following his eating plan approximately 70% of the time. Calvin Hancock states he is having physical therapy 60 minutes 2 times per week. ? ?Today's visit was #: 10 ?Starting weight: 316 lbs ?Starting date: 08/05/2021 ?Today's weight: 303 lbs ?Today's date: 02/16/2022 ?Total lbs lost to date: 13 ?Total lbs lost since last in-office visit: 0 ? ?Interim History: Calvin Hancock continues to be frustrated with weight gain, feels he's meeting his calories and protein in take. He has eaten out 4 times this week. Not skipping meals. He drinks water daily, he rarely drinks a tea or lemonade. ? ?Subjective:  ? ?1. Essential hypertension ?Calvin Hancock is currently taking HCTZ 25mg , Norvasc 5 mg, and Benicar 40 mg. denies any chest pain,shortness of breath or palpitations. He saw cardiology last on 12/01/2021. Since 12/13/21 he has gained 15 lbs, water weight. Last echo 01/24/2021. His wife reports that "he breaths hard and heavy". Notes fatigue over the past couple of days, not checking blood pressure at home. ? ?2. OSA (obstructive sleep apnea) ?Calvin Hancock is using CPAP nightly. ? ?Assessment/Plan:  ? ?1. Essential hypertension ?Calvin Hancock will follow up with cardiology today to discuss weight gain and shortness of breath.  Continue meds as directed.  ? ?2. OSA (obstructive sleep apnea) ?Calvin Hancock will continue to use CPAP nightly. ? ?3. Obesity with current BMI of 43.5 ?Calvin Hancock is currently in the action stage of change. As such, his goal is to continue with weight loss efforts. He has agreed to the Category 4 Plan.  ? ?Exercise goals: As is. ? ?Behavioral modification strategies: increasing lean protein intake, increasing water intake, no skipping meals, meal planning and cooking strategies, and keeping a strict food journal. ? ?Calvin Hancock has agreed to  follow-up with our clinic in 3 weeks. He was informed of the importance of frequent follow-up visits to maximize his success with intensive lifestyle modifications for his multiple health conditions.  ? ?Objective:  ? ?Blood pressure 128/79, pulse 71, temperature (!) 97.3 ?F (36.3 ?C), height 5\' 10"  (1.778 m), weight (!) 303 lb (137.4 kg), SpO2 95 %. ?Body mass index is 43.48 kg/m?. ? ?General: Cooperative, alert, well developed, in no acute distress. ?HEENT: Conjunctivae and lids unremarkable. ?Cardiovascular: Regular rhythm.  ?Lungs: Normal work of breathing. ?Neurologic: No focal deficits.  ? ?Lab Results  ?Component Value Date  ? CREATININE 1.18 08/06/2021  ? BUN 15 08/06/2021  ? NA 139 08/06/2021  ? K 3.9 08/06/2021  ? CL 100 08/06/2021  ? CO2 31 08/06/2021  ? ?Lab Results  ?Component Value Date  ? ALT 31 08/06/2021  ? AST 29 08/06/2021  ? ALKPHOS 34 (L) 08/06/2021  ? BILITOT 0.6 08/06/2021  ? ?Lab Results  ?Component Value Date  ? HGBA1C 5.9 (H) 08/05/2021  ? HGBA1C 6.2 (H) 01/25/2021  ? ?Lab Results  ?Component Value Date  ? INSULIN 12.5 08/05/2021  ? ?Lab Results  ?Component Value Date  ? TSH 1.090 08/05/2021  ? ?Lab Results  ?Component Value Date  ? CHOL 179 08/05/2021  ? HDL 52 08/05/2021  ? LDLCALC 116 (H) 08/05/2021  ? TRIG 55 08/05/2021  ? CHOLHDL 3.8 01/25/2021  ? ?Lab Results  ?Component Value Date  ? VD25OH 43.8 08/05/2021  ? ?Lab Results  ?Component Value Date  ? WBC 5.4 08/06/2021  ?  HGB 15.2 08/06/2021  ? HCT 46.7 08/06/2021  ? MCV 88.4 08/06/2021  ? PLT 230 08/06/2021  ? ?No results found for: IRON, TIBC, FERRITIN ? ?Attestation Statements:  ? ?Reviewed by clinician on day of visit: allergies, medications, problem list, medical history, surgical history, family history, social history, and previous encounter notes. ? ?I, Calvin Hancock, am acting as transcriptionist for Irene Limbo, FNP.Marland Kitchen ? ?I have reviewed the above documentation for accuracy and completeness, and I agree with the above.  Irene Limbo, FNP  ?

## 2022-02-17 NOTE — Telephone Encounter (Signed)
FYI

## 2022-02-25 ENCOUNTER — Other Ambulatory Visit (INDEPENDENT_AMBULATORY_CARE_PROVIDER_SITE_OTHER): Payer: Self-pay | Admitting: Family Medicine

## 2022-02-25 DIAGNOSIS — I10 Essential (primary) hypertension: Secondary | ICD-10-CM

## 2022-03-01 ENCOUNTER — Ambulatory Visit (HOSPITAL_COMMUNITY): Payer: Managed Care, Other (non HMO) | Attending: Cardiology

## 2022-03-01 DIAGNOSIS — I5032 Chronic diastolic (congestive) heart failure: Secondary | ICD-10-CM | POA: Insufficient documentation

## 2022-03-01 LAB — ECHOCARDIOGRAM COMPLETE
Area-P 1/2: 3.62 cm2
S' Lateral: 3.3 cm

## 2022-03-01 MED ORDER — PERFLUTREN LIPID MICROSPHERE
1.0000 mL | INTRAVENOUS | Status: AC | PRN
  Administered 2022-03-01: 2 mL via INTRAVENOUS

## 2022-03-02 ENCOUNTER — Encounter: Payer: Self-pay | Admitting: Cardiovascular Disease

## 2022-03-05 IMAGING — MR MR CARD MORPHOLOGY WO/W CM
45 of 48 series · 45 of 48 positions shown · IV contrast (gadavist)
Comparison: none

CLINICAL DATA: Clinical question of myocarditis
54 year-old African American Male

Study assumes HCT 42
EXAM:
CARDIAC MRI
TECHNIQUE: The patient was scanned on a 1.5 Tesla GE magnet. A dedicated
cardiac coil was used. Functional imaging was done using Fiest[employer]
sequences. [DATE], and 4 chamber views were done to assess for RWMA's.
Modified Negao rule using a short axis stack was used to
calculate an ejection fraction on a dedicated work station using
[employer] software. The patient received 17 cc of Gadavist. After 10
minutes inversion recovery sequences were used to assess for
infiltration and scar tissue.
CONTRAST:  17 cc  of Gadavist

[Series 4: [id number] · axial · 8.0mm · 1.72mm/px · 1 of 16 slices shown]
[im 1/16]
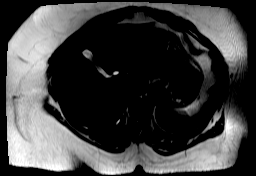

[Series 8: bSSFP · oblique · 8.0mm · 1.83mm/px · 1 of 25 slices shown (1 of 22)]
[im 1/25]
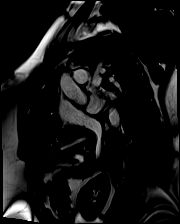

[Series 9: bSSFP · oblique · 8.0mm · 1.83mm/px · 1 of 25 slices shown (2 of 22)]
[im 1/25]
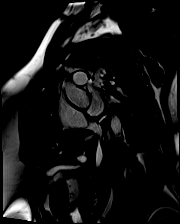

[Series 10: bSSFP · oblique · 8.0mm · 1.83mm/px · 1 of 25 slices shown (3 of 22)]
[im 1/25]
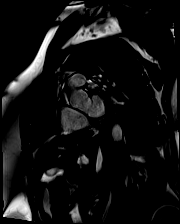

[Series 11: bSSFP · oblique · 8.0mm · 1.83mm/px · 1 of 25 slices shown (4 of 22)]
[im 1/25]
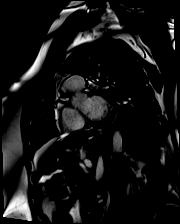

[Series 12: bSSFP · oblique · 8.0mm · 1.83mm/px · 1 of 25 slices shown (5 of 22)]
[im 1/25]
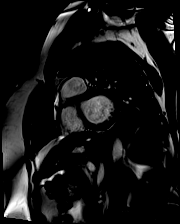

[Series 13: bSSFP · oblique · 8.0mm · 1.83mm/px · 1 of 25 slices shown (6 of 22)]
[im 1/25]
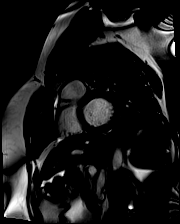

[Series 14: bSSFP · oblique · 8.0mm · 1.83mm/px · 1 of 25 slices shown (7 of 22)]
[im 1/25]
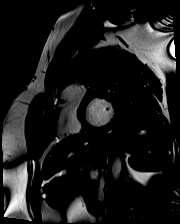

[Series 15: bSSFP · oblique · 8.0mm · 1.83mm/px · 1 of 25 slices shown (8 of 22)]
[im 1/25]
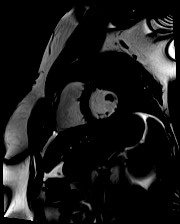

[Series 16: bSSFP · oblique · 8.0mm · 1.83mm/px · 1 of 25 slices shown (9 of 22)]
[im 1/25]
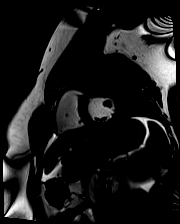

[Series 17: bSSFP · oblique · 8.0mm · 1.83mm/px · 1 of 25 slices shown (10 of 22)]
[im 1/25]
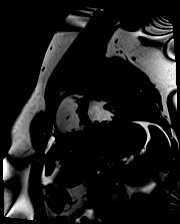

[Series 18: bSSFP · oblique · 8.0mm · 1.83mm/px · 1 of 25 slices shown (11 of 22)]
[im 1/25]
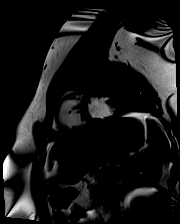

[Series 19: bSSFP · oblique · 8.0mm · 1.83mm/px · 1 of 25 slices shown (12 of 22)]
[im 1/25]
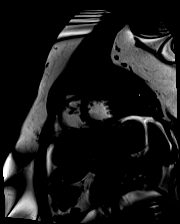

[Series 20: bSSFP · oblique · 8.0mm · 1.83mm/px · 1 of 25 slices shown (13 of 22)]
[im 1/25]
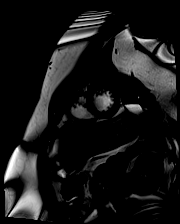

[Series 21: bSSFP · oblique · 8.0mm · 1.83mm/px · 1 of 25 slices shown (14 of 22)]
[im 1/25]
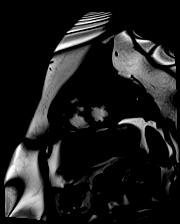

[Series 22: bSSFP · oblique · 8.0mm · 1.83mm/px · 1 of 25 slices shown (15 of 22)]
[im 1/25]
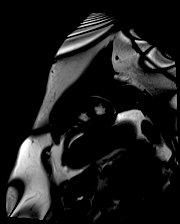

[Series 23: bSSFP · oblique · 8.0mm · 1.83mm/px · 1 of 25 slices shown (16 of 22)]
[im 1/25]
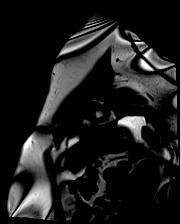

[Series 24: bSSFP · oblique · 8.0mm · 1.83mm/px · 1 of 25 slices shown (17 of 22)]
[im 1/25]
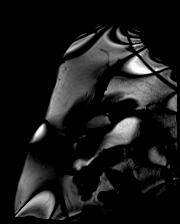

[Series 25: bSSFP · oblique · 8.0mm · 1.83mm/px · 1 of 25 slices shown (18 of 22)]
[im 1/25]
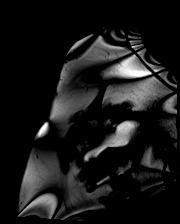

[Series 26: STIR · oblique · 8.0mm · 2.07mm/px · 1 of 17 slices shown]
[im 1/17]
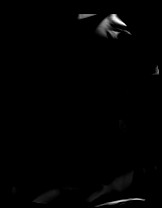

[Series 27: (id)_long_t1 · oblique · 8.0mm · 1.64mm/px · 1 of 24 slices shown]
[im 1/24]
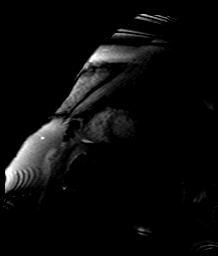

[Series 28: (id)_long_t1_moco · oblique · 8.0mm · 1.64mm/px · 1 of 24 slices shown]
[im 1/24]
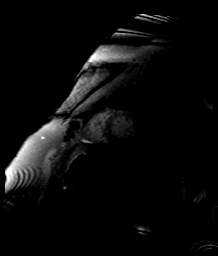

[Series 29: (id)_long_t1_moco_t1 · 1 of 3 slices shown (1 of 2)]
[im 1/3]
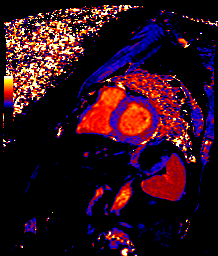

[Series 29: (id)_long_t1_moco_t1 · oblique · 8.0mm · 1.64mm/px · 1 of 3 slices shown (2 of 2)]
[im 1/3]
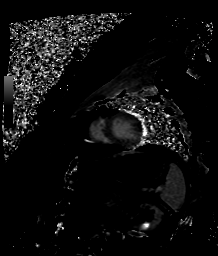

[Series 31: (id)_trufi · oblique · 8.0mm · 2.24mm/px · 1 of 9 slices shown]
[im 1/9]
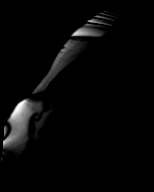

[Series 32: (id)_trufi_moco · oblique · 8.0mm · 2.24mm/px · 1 of 9 slices shown]
[im 1/9]
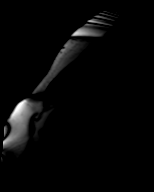

[Series 33: (id)_trufi_moco_t2 · oblique · 8.0mm · 2.24mm/px · 1 of 3 slices shown]
[im 1/3]
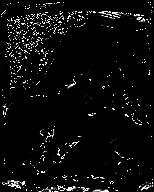

[Series 35: bSSFP · oblique · 6.0mm · 1.72mm/px · 1 of 25 slices shown (19 of 22)]
[im 1/25]
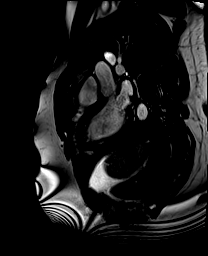

[Series 36: bSSFP · oblique · 6.0mm · 1.72mm/px · 1 of 25 slices shown (20 of 22)]
[im 1/25]
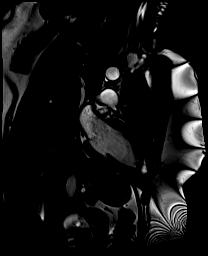

[Series 37: bSSFP · axial · 6.0mm · 1.68mm/px · 1 of 25 slices shown (21 of 22)]
[im 1/25]
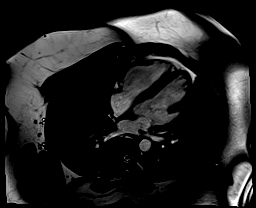

[Series 38: pre short axis · oblique · non-contrast · 8.0mm · 2.75mm/px · 1 of 10 slices shown (1 of 6)]
[im 1/10]
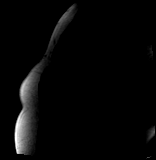

[Series 39: pre short axis · oblique · non-contrast · 8.0mm · 2.75mm/px · 1 of 10 slices shown (2 of 6)]
[im 1/10]
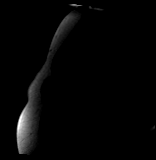

[Series 40: pre short axis · oblique · non-contrast · 8.0mm · 2.75mm/px · 1 of 10 slices shown (3 of 6)]
[im 1/10]
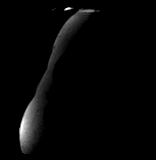

[Series 41: pre short axis · oblique · non-contrast · 8.0mm · 2.75mm/px · 1 of 10 slices shown (4 of 6)]
[im 1/10]
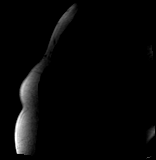

[Series 42: pre short axis · oblique · non-contrast · 8.0mm · 2.75mm/px · 1 of 10 slices shown (5 of 6)]
[im 1/10]
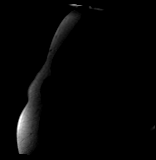

[Series 43: pre short axis · oblique · non-contrast · 8.0mm · 2.75mm/px · 1 of 10 slices shown (6 of 6)]
[im 1/10]
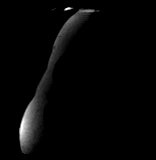

[Series 44: rest short axis · oblique · 8.0mm · 2.69mm/px · 1 of 60 slices shown (1 of 6)]
[im 1/60]
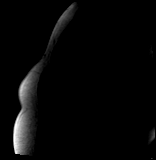

[Series 45: rest short axis · oblique · 8.0mm · 2.69mm/px · 1 of 60 slices shown (2 of 6)]
[im 1/60]
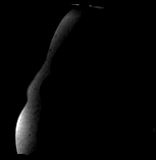

[Series 46: rest short axis · oblique · 8.0mm · 2.69mm/px · 1 of 60 slices shown (3 of 6)]
[im 1/60]
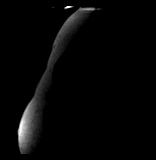

[Series 47: rest short axis · oblique · 8.0mm · 2.69mm/px · 1 of 60 slices shown (4 of 6)]
[im 1/60]
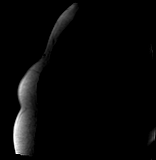

[Series 48: rest short axis · oblique · 8.0mm · 2.69mm/px · 1 of 60 slices shown (5 of 6)]
[im 1/60]
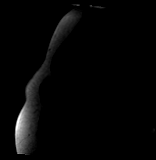

[Series 49: rest short axis · oblique · 8.0mm · 2.69mm/px · 1 of 60 slices shown (6 of 6)]
[im 1/60]
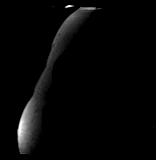

[Series 50: bSSFP · sagittal · 6.0mm · 1.72mm/px · 1 of 25 slices shown (22 of 22)]
[im 1/25]
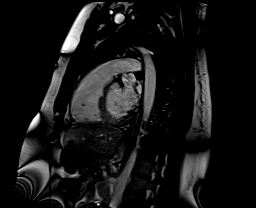

[Series 51: aortic valve cine · oblique · 6.0mm · 1.64mm/px · 1 of 25 slices shown]
[im 1/25]
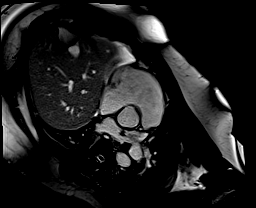

[Series 52: cine rvit · oblique · 6.0mm · 1.68mm/px · 1 of 25 slices shown]
[im 1/25]
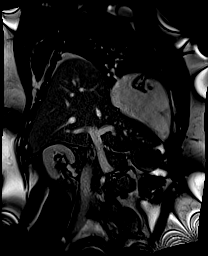

[45 of 48 positions shown; findings below may reference images not displayed]

FINDINGS: 1. Normal left ventricular size, with LVEDD 53 mm, and LVEDVi 75
mL/m2.

Normal left ventricular thickness, with intraventricular septal
thickness of 7 mm, posterior wall thickness of 9 mm, and septal to
posterior ratio < 1.5.

Normal left ventricular systolic function (LVEF =55%). There are no
regional wall motion abnormalities.

Left ventricular parametric mapping notable for elevated T2 signal,
most prominent in the inferolateral wall (68 msec in the base 61
msec in the mid) with concomitant elevation in native T1 signal
(3464 msec and 1123 msec).

There is late gadolinium enhancement in the left ventricular
myocardium: Mid-myocardial up to 75% of myocardium in the basal
inferolateral wall, Mid-myocardial up to 50% of myocardium in the
mid inferolateral wall.

2. Normal right ventricular size with RVEDVI 90 mL/m2.

Normal right ventricular thickness.

Normal right ventricular systolic function (RVEF =46%). There are no
regional wall motion abnormalities or aneurysms.

3. Normal left and right atrial size, with LAESV 54 mL and RAESV 52
mL.

4. Normal size of the aortic root, ascending aorta and pulmonary
artery.

5.  No significant valvular abnormalities.

6. Increased pericardial thickening: 6 mm. No pericardial effusion.
Gadolinium hyper-enhancement of pericardium of pericardium
over-lying the basal inferolateral wall. No qualitative evidence of
increase T2 signal.

7. Grossly, no extracardiac findings. Recommended dedicated study if
concerned for non-cardiac pathology.

8.  Motion artifact noted.
IMPRESSION: 1.  Normal left ventricular function, LVEF 55%.

2. Parametric mapping and gadolinium enhancement pattern consistent
with myocarditis; modified Natuka Alguzashvili Criteria met.

3. Increase in pericardial thickening and enhancement consistent
with pericarditis.

4.  Study consistent with myopericarditis.

## 2022-03-10 ENCOUNTER — Ambulatory Visit (INDEPENDENT_AMBULATORY_CARE_PROVIDER_SITE_OTHER): Payer: Managed Care, Other (non HMO) | Admitting: Family Medicine

## 2022-03-10 ENCOUNTER — Encounter (INDEPENDENT_AMBULATORY_CARE_PROVIDER_SITE_OTHER): Payer: Self-pay | Admitting: Family Medicine

## 2022-03-10 VITALS — BP 118/74 | HR 70 | Temp 97.5°F | Ht 70.0 in | Wt 304.0 lb

## 2022-03-10 DIAGNOSIS — E7849 Other hyperlipidemia: Secondary | ICD-10-CM | POA: Diagnosis not present

## 2022-03-10 DIAGNOSIS — E669 Obesity, unspecified: Secondary | ICD-10-CM | POA: Diagnosis not present

## 2022-03-10 DIAGNOSIS — Z6841 Body Mass Index (BMI) 40.0 and over, adult: Secondary | ICD-10-CM | POA: Diagnosis not present

## 2022-03-10 DIAGNOSIS — Z9189 Other specified personal risk factors, not elsewhere classified: Secondary | ICD-10-CM

## 2022-03-10 DIAGNOSIS — I10 Essential (primary) hypertension: Secondary | ICD-10-CM | POA: Diagnosis not present

## 2022-03-10 MED ORDER — AMLODIPINE BESYLATE 5 MG PO TABS: 5.0000 mg | ORAL_TABLET | Freq: Every morning | ORAL | 0 refills | Status: DC

## 2022-03-16 IMAGING — CR DG CHEST 2V
2 series · 2 of 2 positions shown · non-contrast
Comparison: None.

CLINICAL DATA: Chest pain recent diagnosis of myocarditis

EXAM:
CHEST - 2 VIEW

[chest pa]
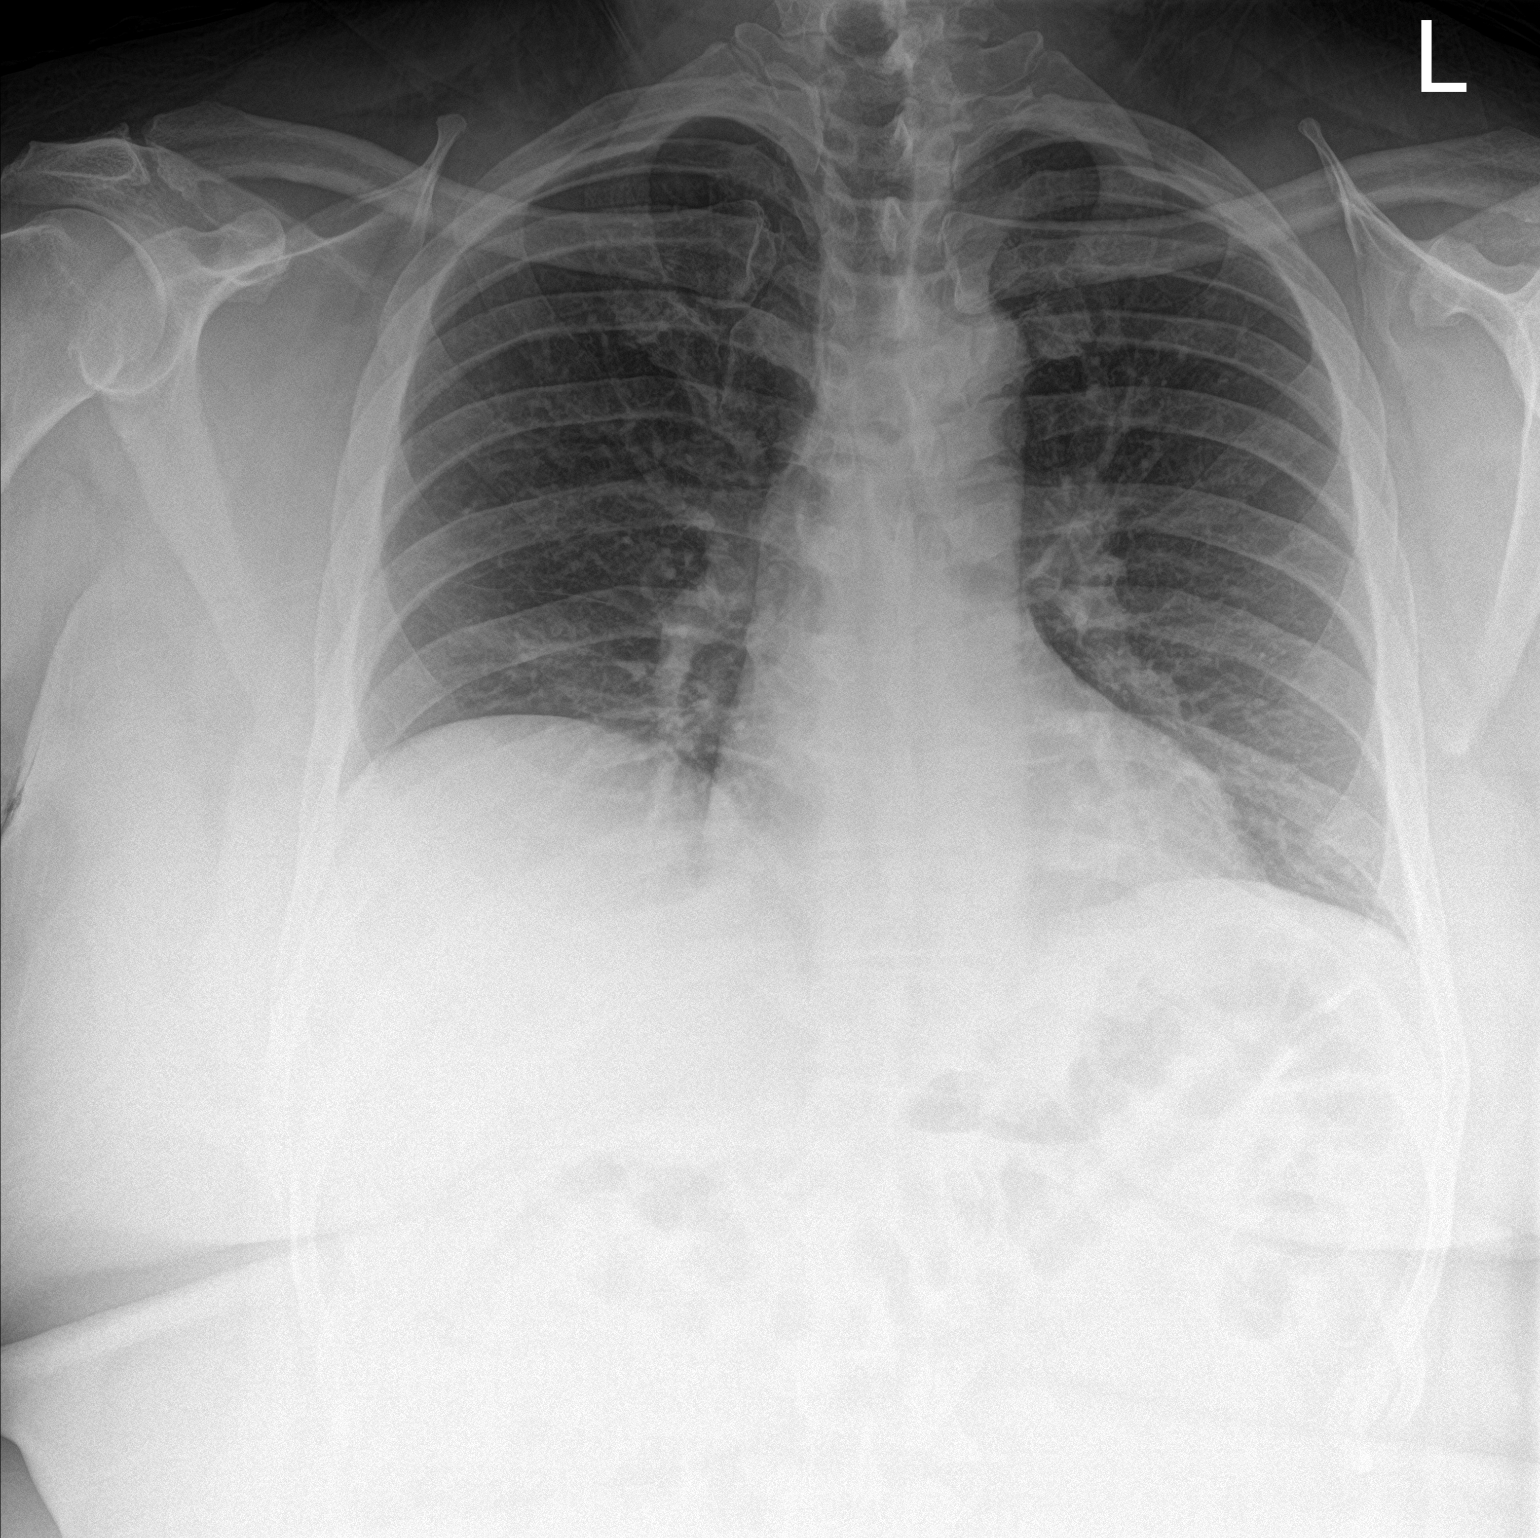

[chest lat]
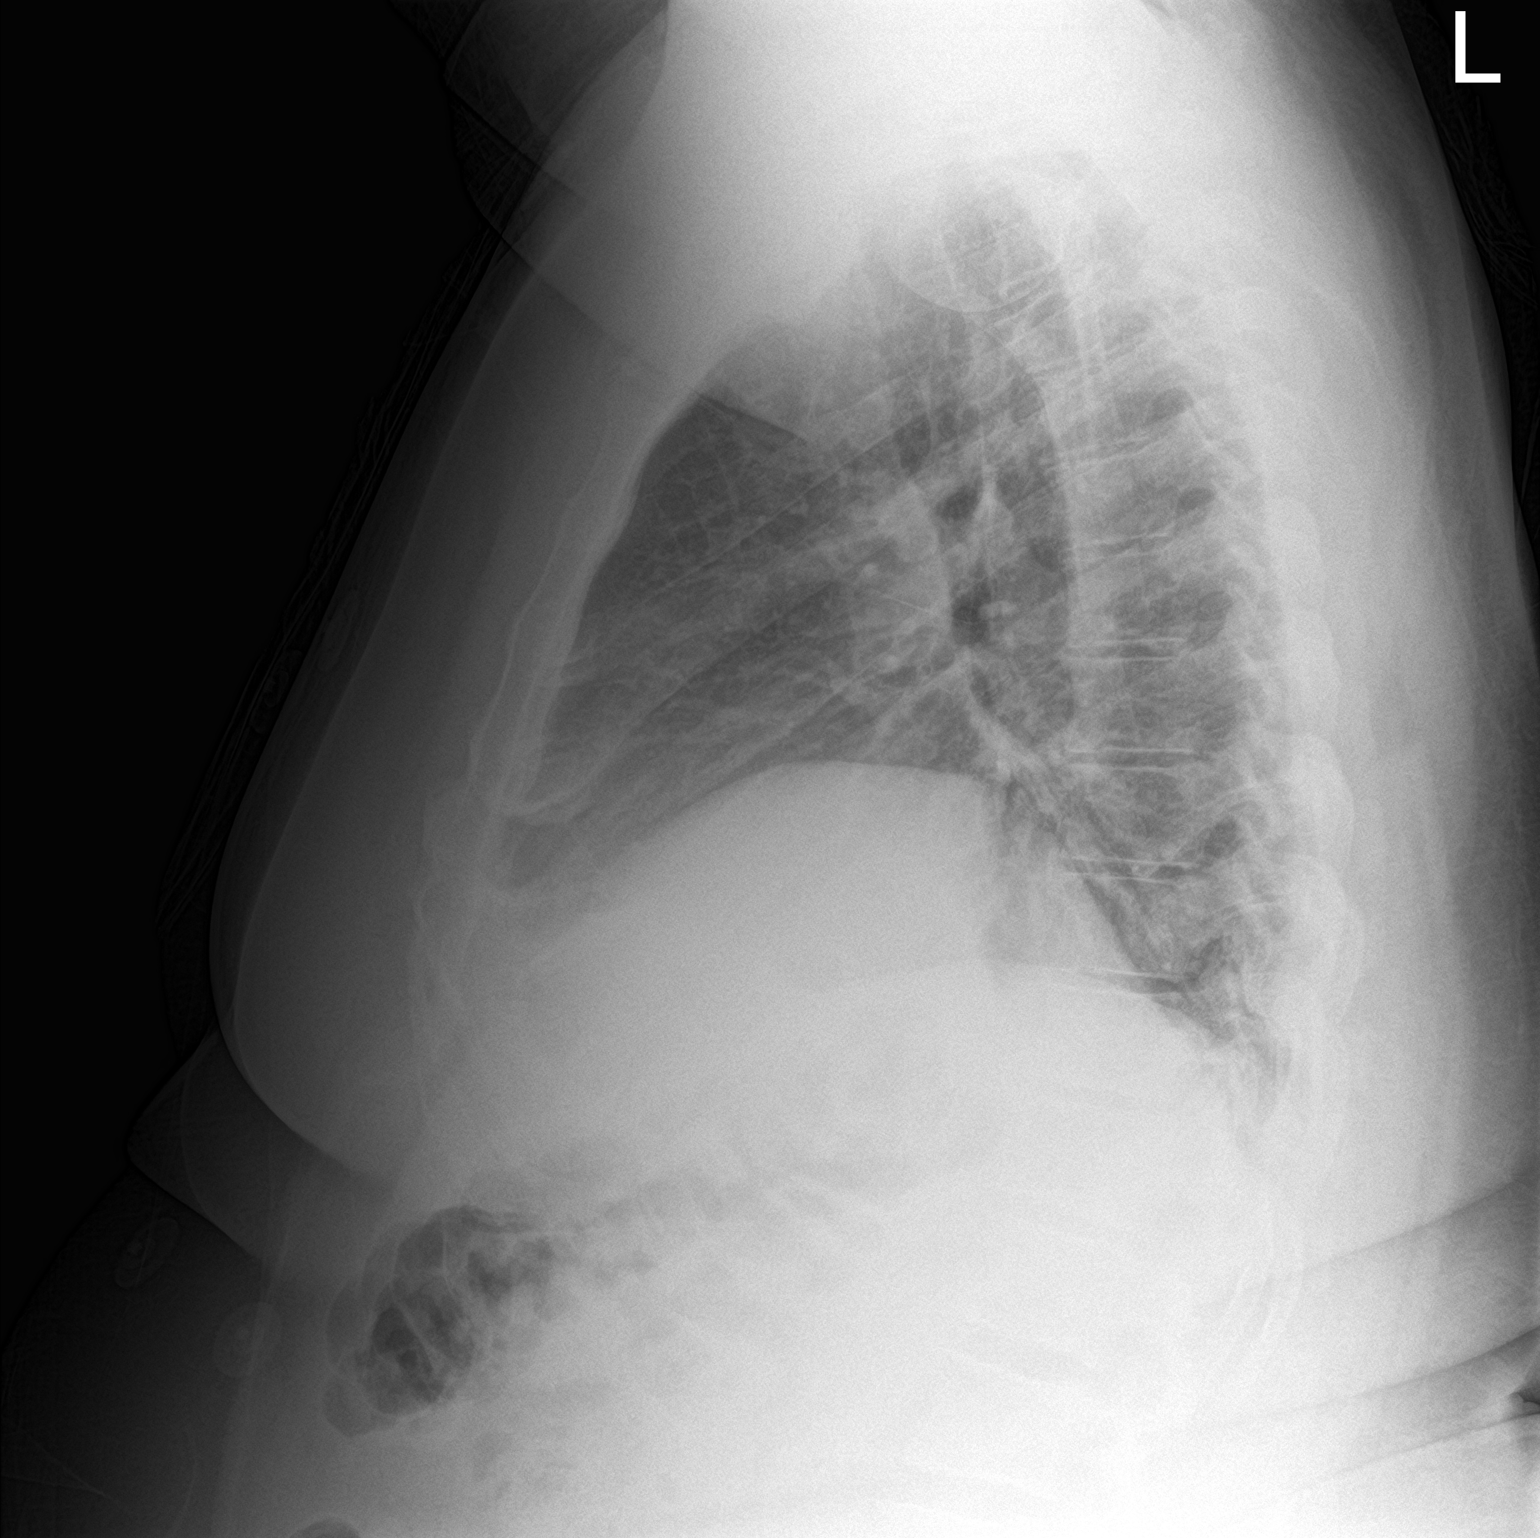

[2 of 2 positions shown; findings below may reference images not displayed]

FINDINGS: The heart size and mediastinal contours are within normal limits.
Both lungs are clear. Stable eventration of the right hemidiaphragm.
The visualized skeletal structures are unremarkable.
IMPRESSION: No active cardiopulmonary disease.

## 2022-03-19 NOTE — Progress Notes (Signed)
Chief Complaint:   OBESITY Calvin Hancock is here to discuss his progress with his obesity treatment plan along with follow-up of his obesity related diagnoses. Calvin Hancock is on the Category 4 Plan and states he is following his eating plan approximately 80% of the time. Calvin Hancock states he is PT 60 minutes 2 times per week.  Today's visit was #: 11 Starting weight: 316 lbs Starting date: 08/05/2021 Today's weight: 304 lbs Today's date: 03/10/2022 Total lbs lost to date: 12 Total lbs lost since last in-office visit: 0  Interim History: Calvin Hancock has been feeling frustrated with lack of weight loss.  He was concerned about increase in water weight at last appointment.  He had an Echo on 03/01/2022, which was normal.  Calvin Hancock just wants to get back on track at dinner time which is most difficult meal to follow. Calvin Hancock recognizes that the last few nights he has been off plan.  Subjective:   1. Essential hypertension Calvin Hancock's blood pressure is well controlled today.  Calvin Hancock denies any chest pain, chest pressure, or headaches.  He has been taking amlodipine, Benicar, HCTZ.  2. Other hyperlipidemia Calvin Hancock's last LDL was 116, HDL 52, Triglycerides 55. He is currently not on a statin.  3. At risk for heart disease Calvin Hancock is at higher than average risk for cardiovascular disease due to obesity.   Assessment/Plan:   1. Essential hypertension Calvin Hancock has agreed to continue Norvasc, we will refill today.  See below.  - amLODipine (NORVASC) 5 MG tablet; Take 1 tablet (5 mg total) by mouth in the morning.  Dispense: 90 tablet; Refill: 0  2. Other hyperlipidemia Calvin Hancock has agreed to get labs done at next appointment with his PCP in June.   3. At risk for heart disease Calvin Hancock was given approximately 15 minutes of coronary artery disease prevention counseling today. He is 56 y.o. male and has risk factors for heart disease including obesity. We discussed intensive lifestyle modifications today with an  emphasis on specific weight loss instructions and strategies.  Repetitive spaced learning was employed today to elicit superior memory formation and behavioral change.    4. Obesity with current BMI of 43.7 Calvin Hancock is currently in the action stage of change. As such, his goal is to continue with weight loss efforts. He has agreed to the Category 3 Plan and keeping a food journal and adhering to recommended goals of 400-600 calories and 40+ protein.   Exercise goals:  As is.   Behavioral modification strategies: increasing lean protein intake, meal planning and cooking strategies, keeping healthy foods in the home, and planning for success.  Calvin Hancock has agreed to follow-up with our clinic in 4 weeks. He was informed of the importance of frequent follow-up visits to maximize his success with intensive lifestyle modifications for his multiple health conditions.   Objective:   Blood pressure 118/74, pulse 70, temperature (!) 97.5 F (36.4 C), height 5\' 10"  (1.778 m), weight (!) 304 lb (137.9 kg), SpO2 95 %. Body mass index is 43.62 kg/m.  General: Cooperative, alert, well developed, in no acute distress. HEENT: Conjunctivae and lids unremarkable. Cardiovascular: Regular rhythm.  Lungs: Normal work of breathing. Neurologic: No focal deficits.   Lab Results  Component Value Date   CREATININE 1.18 08/06/2021   BUN 15 08/06/2021   NA 139 08/06/2021   K 3.9 08/06/2021   CL 100 08/06/2021   CO2 31 08/06/2021   Lab Results  Component Value Date   ALT 31 08/06/2021   AST  29 08/06/2021   ALKPHOS 34 (L) 08/06/2021   BILITOT 0.6 08/06/2021   Lab Results  Component Value Date   HGBA1C 5.9 (H) 08/05/2021   HGBA1C 6.2 (H) 01/25/2021   Lab Results  Component Value Date   INSULIN 12.5 08/05/2021   Lab Results  Component Value Date   TSH 1.090 08/05/2021   Lab Results  Component Value Date   CHOL 179 08/05/2021   HDL 52 08/05/2021   LDLCALC 116 (H) 08/05/2021   TRIG 55 08/05/2021    CHOLHDL 3.8 01/25/2021   Lab Results  Component Value Date   VD25OH 43.8 08/05/2021   Lab Results  Component Value Date   WBC 5.4 08/06/2021   HGB 15.2 08/06/2021   HCT 46.7 08/06/2021   MCV 88.4 08/06/2021   PLT 230 08/06/2021   No results found for: IRON, TIBC, FERRITIN Attestation Statements:   Reviewed by clinician on day of visit: allergies, medications, problem list, medical history, surgical history, family history, social history, and previous encounter notes.  I, Malcolm Metro, RMA, am acting as transcriptionist for Reuben Likes, MD.  I have reviewed the above documentation for accuracy and completeness, and I agree with the above. - Reuben Likes, MD

## 2022-04-07 ENCOUNTER — Ambulatory Visit (INDEPENDENT_AMBULATORY_CARE_PROVIDER_SITE_OTHER): Payer: Managed Care, Other (non HMO) | Admitting: Family Medicine

## 2022-04-07 ENCOUNTER — Encounter (INDEPENDENT_AMBULATORY_CARE_PROVIDER_SITE_OTHER): Payer: Self-pay | Admitting: Family Medicine

## 2022-04-07 VITALS — BP 126/79 | HR 68 | Temp 97.8°F | Ht 70.0 in | Wt 305.0 lb

## 2022-04-07 DIAGNOSIS — Z6841 Body Mass Index (BMI) 40.0 and over, adult: Secondary | ICD-10-CM

## 2022-04-07 DIAGNOSIS — R7303 Prediabetes: Secondary | ICD-10-CM

## 2022-04-07 DIAGNOSIS — E669 Obesity, unspecified: Secondary | ICD-10-CM | POA: Diagnosis not present

## 2022-04-07 DIAGNOSIS — I10 Essential (primary) hypertension: Secondary | ICD-10-CM | POA: Diagnosis not present

## 2022-04-13 NOTE — Progress Notes (Signed)
Chief Complaint:   OBESITY Calvin Hancock is here to discuss his progress with his obesity treatment plan along with follow-up of his obesity related diagnoses. Calvin Hancock is on the Category 3 Plan and keeping a food journal and adhering to recommended goals of 400-600 calories and 40+ grams of  protein and states he is following his eating plan approximately 75-80% of the time. Calvin Hancock states he is in physical therapy 60 minutes 2 times per week.  Today's visit was #: 12 Starting weight: 316 lbs Starting date: 08/05/2021 Today's weight: 305 lbs Today's date: 04/07/2022 Total lbs lost to date: 11 lbs Total lbs lost since last in-office visit: 0  Interim History: Calvin Hancock has had a frustrating and stressful last few weeks. He has occasionally chosen indulgent options that aren't on plan. His birthday is next month but not planning anything. He wants to stay consistently on plan over next few weeks.    Subjective:   1. Essential hypertension Calvin Hancock's blood pressure is well controlled today. She is currently Amlodipine and Benicar. Denies chest pain, chest pressure and headache.  2. Prediabetes Calvin Hancock's last A1c at 5.9, he reports 5.6 in Feb 2023. Insulin at 12.5, he is not on medication.  Assessment/Plan:   1. Essential hypertension Patient will continue current medication with no change in dose.  2. Prediabetes Patient will repeat labs at next appointment. (Patient said he has labs from Feb, so pending result).  3. Obesity with current BMI of 43.8 Calvin Hancock is currently in the action stage of change. As such, his goal is to continue with weight loss efforts. He has agreed to the Category 3 Plan and keeping a food journal and adhering to recommended goals of 400-600 calories and 40+ grams of protein for supper.  Exercise goals: All adults should avoid inactivity. Some physical activity is better than none, and adults who participate in any amount of physical activity gain some health  benefits.  Behavioral modification strategies: increasing lean protein intake, meal planning and cooking strategies, and keeping healthy foods in the home.  Calvin Hancock has agreed to follow-up with our clinic in 4 weeks. He was informed of the importance of frequent follow-up visits to maximize his success with intensive lifestyle modifications for his multiple health conditions.   Objective:   Blood pressure 126/79, pulse 68, temperature 97.8 F (36.6 C), height 5\' 10"  (1.778 m), weight (!) 305 lb (138.3 kg), SpO2 98 %. Body mass index is 43.76 kg/m.  General: Cooperative, alert, well developed, in no acute distress. HEENT: Conjunctivae and lids unremarkable. Cardiovascular: Regular rhythm.  Lungs: Normal work of breathing. Neurologic: No focal deficits.   Lab Results  Component Value Date   CREATININE 1.18 08/06/2021   BUN 15 08/06/2021   NA 139 08/06/2021   K 3.9 08/06/2021   CL 100 08/06/2021   CO2 31 08/06/2021   Lab Results  Component Value Date   ALT 31 08/06/2021   AST 29 08/06/2021   ALKPHOS 34 (L) 08/06/2021   BILITOT 0.6 08/06/2021   Lab Results  Component Value Date   HGBA1C 5.9 (H) 08/05/2021   HGBA1C 6.2 (H) 01/25/2021   Lab Results  Component Value Date   INSULIN 12.5 08/05/2021   Lab Results  Component Value Date   TSH 1.090 08/05/2021   Lab Results  Component Value Date   CHOL 179 08/05/2021   HDL 52 08/05/2021   LDLCALC 116 (H) 08/05/2021   TRIG 55 08/05/2021   CHOLHDL 3.8 01/25/2021   Lab  Results  Component Value Date   VD25OH 43.8 08/05/2021   Lab Results  Component Value Date   WBC 5.4 08/06/2021   HGB 15.2 08/06/2021   HCT 46.7 08/06/2021   MCV 88.4 08/06/2021   PLT 230 08/06/2021   No results found for: IRON, TIBC, FERRITIN   Attestation Statements:   Reviewed by clinician on day of visit: allergies, medications, problem list, medical history, surgical history, family history, social history, and previous encounter  notes.  I, Elnora Morrison, RMA am acting as transcriptionist for Coralie Common, MD.  I have reviewed the above documentation for accuracy and completeness, and I agree with the above. - Coralie Common, MD

## 2022-05-05 ENCOUNTER — Encounter (INDEPENDENT_AMBULATORY_CARE_PROVIDER_SITE_OTHER): Payer: Self-pay | Admitting: Family Medicine

## 2022-05-05 ENCOUNTER — Ambulatory Visit (INDEPENDENT_AMBULATORY_CARE_PROVIDER_SITE_OTHER): Payer: Managed Care, Other (non HMO) | Admitting: Family Medicine

## 2022-05-05 VITALS — BP 111/72 | HR 77 | Temp 97.8°F | Ht 70.0 in | Wt 314.0 lb

## 2022-05-05 DIAGNOSIS — R632 Polyphagia: Secondary | ICD-10-CM

## 2022-05-05 DIAGNOSIS — E559 Vitamin D deficiency, unspecified: Secondary | ICD-10-CM

## 2022-05-05 DIAGNOSIS — R7303 Prediabetes: Secondary | ICD-10-CM

## 2022-05-05 DIAGNOSIS — E7849 Other hyperlipidemia: Secondary | ICD-10-CM

## 2022-05-05 DIAGNOSIS — I10 Essential (primary) hypertension: Secondary | ICD-10-CM

## 2022-05-05 DIAGNOSIS — Z6841 Body Mass Index (BMI) 40.0 and over, adult: Secondary | ICD-10-CM

## 2022-05-05 DIAGNOSIS — E669 Obesity, unspecified: Secondary | ICD-10-CM

## 2022-05-05 MED ORDER — TOPIRAMATE 25 MG PO TABS: 25.0000 mg | ORAL_TABLET | Freq: Two times a day (BID) | ORAL | 0 refills | Status: DC

## 2022-05-05 MED ORDER — LOMAIRA 8 MG PO TABS: 8.0000 mg | ORAL_TABLET | Freq: Every morning | ORAL | 0 refills | Status: DC

## 2022-05-06 LAB — COMPREHENSIVE METABOLIC PANEL
ALT: 23 IU/L (ref 0–44)
AST: 21 IU/L (ref 0–40)
Albumin/Globulin Ratio: 1.6 (ref 1.2–2.2)
Albumin: 4.2 g/dL (ref 3.8–4.9)
Alkaline Phosphatase: 49 IU/L (ref 44–121)
BUN/Creatinine Ratio: 19 (ref 9–20)
BUN: 21 mg/dL (ref 6–24)
Bilirubin Total: 0.3 mg/dL (ref 0.0–1.2)
CO2: 28 mmol/L (ref 20–29)
Calcium: 9.2 mg/dL (ref 8.7–10.2)
Chloride: 102 mmol/L (ref 96–106)
Creatinine, Ser: 1.12 mg/dL (ref 0.76–1.27)
Globulin, Total: 2.7 g/dL (ref 1.5–4.5)
Glucose: 91 mg/dL (ref 70–99)
Potassium: 4.2 mmol/L (ref 3.5–5.2)
Sodium: 146 mmol/L — ABNORMAL HIGH (ref 134–144)
Total Protein: 6.9 g/dL (ref 6.0–8.5)
eGFR: 78 mL/min/{1.73_m2} (ref >59)

## 2022-05-06 LAB — INSULIN, RANDOM: INSULIN: 14.9 u[IU]/mL (ref 2.6–24.9)

## 2022-05-06 LAB — LIPID PANEL WITH LDL/HDL RATIO
Cholesterol, Total: 165 mg/dL (ref 100–199)
HDL: 52 mg/dL (ref >39)
LDL Chol Calc (NIH): 103 mg/dL — ABNORMAL HIGH (ref 0–99)
LDL/HDL Ratio: 2 ratio (ref 0.0–3.6)
Triglycerides: 48 mg/dL (ref 0–149)
VLDL Cholesterol Cal: 10 mg/dL (ref 5–40)

## 2022-05-06 LAB — VITAMIN D 25 HYDROXY (VIT D DEFICIENCY, FRACTURES): Vit D, 25-Hydroxy: 60 ng/mL (ref 30.0–100.0)

## 2022-05-06 LAB — HEMOGLOBIN A1C
Est. average glucose Bld gHb Est-mCnc: 123 mg/dL
Hgb A1c MFr Bld: 5.9 % — ABNORMAL HIGH (ref 4.8–5.6)

## 2022-05-09 NOTE — Progress Notes (Signed)
Chief Complaint:   OBESITY Calvin Hancock is here to discuss his progress with his obesity treatment plan along with follow-up of his obesity related diagnoses. Brendan is on the Category 3 Plan + 400-600 + 40+ grams of protein for supper and states he is following his eating plan approximately 50% of the time. Trevaughn states he is exercising 0 minutes 0 times per week.  Today's visit was #: 13 Starting weight: 316 lbs Starting date: 08/05/2021 Today's weight: 314 lbs Today's date: 05/05/2022 Total lbs lost to date: 2 lbs Total lbs lost since last in-office visit: 9  Interim History: Calvin Hancock has really struggled with sweet tea consumption over the last few weeks. He has also felt he has difficulty controlling intake over last few weeks and not necessarily due to hunger.   Subjective:   1. Polyphagia PDMP (prescription drug monitoring program) checked; Control Substance Contract signed. Significant increase in food consumption over last few weeks. Blood pressure well controlled. Goal is 5% after 3 months on 1st treatment dose- 16 lbs.  2. Prediabetes Calvin Hancock not currently on medication. His last A1c was 5.9+, insulin was 12.5.  3. Essential hypertension Calvin Hancock's blood pressure is well controlled today. Denies chest pain, chest pressure and headache. He is currently taking Norvase, Benicar and Lasix.  4. Other hyperlipidemia Calvin Hancock is not currently taking statin.  5. Vitamin D deficiency Calvin Hancock is not taking prescription Vit D. Denies any nausea, vomiting or muscle weakness. He notes fatigue.  Assessment/Plan:   1. Polyphagia Start Phentermine 8 mg by mouth daily and Start Topiramate 25 mg by mouth daily.  -Start Phentermine HCl (LOMAIRA) 8 MG TABS; Take 8 mg by mouth every morning.  Dispense: 30 tablet; Refill: 0  -Start topiramate (TOPAMAX) 25 MG tablet; Take 1 tablet (25 mg total) by mouth 2 (two) times daily.  Dispense: 60 tablet; Refill: 0  2. Prediabetes We will obtain labs  today.  - Hemoglobin A1c - Insulin, random  3. Essential hypertension We will obtain labs today.  - Comprehensive metabolic panel  4. Other hyperlipidemia We will obtain labs today.  - Lipid Panel With LDL/HDL Ratio  5. Vitamin D deficiency We will obtain labs today.  - VITAMIN D 25 Hydroxy (Vit-D Deficiency, Fractures)  6. Obesity with current BMI of 45.1 Calvin Hancock is currently in the action stage of change. As such, his goal is to continue with weight loss efforts. He has agreed to the Category 3 Plan.   Exercise goals: No exercise has been prescribed at this time.  Behavioral modification strategies: increasing lean protein intake, meal planning and cooking strategies, keeping healthy foods in the home, and planning for success.  [name] has agreed to follow-up with our clinic in 4 weeks. He was informed of the importance of frequent follow-up visits to maximize his success with intensive lifestyle modifications for his multiple health conditions.   Calvin Hancock was informed we would discuss his lab results at his next visit unless there is a critical issue that needs to be addressed sooner. Calvin Hancock agreed to keep his next visit at the agreed upon time to discuss these results.  Objective:   Blood pressure 111/72, pulse 77, temperature 97.8 F (36.6 C), height 5\' 10"  (1.778 m), weight (!) 314 lb (142.4 kg), SpO2 96 %. Body mass index is 45.05 kg/m.  General: Cooperative, alert, well developed, in no acute distress. HEENT: Conjunctivae and lids unremarkable. Cardiovascular: Regular rhythm.  Lungs: Normal work of breathing. Neurologic: No focal deficits.   Lab  Results  Component Value Date   CREATININE 1.12 05/05/2022   BUN 21 05/05/2022   NA 146 (H) 05/05/2022   K 4.2 05/05/2022   CL 102 05/05/2022   CO2 28 05/05/2022   Lab Results  Component Value Date   ALT 23 05/05/2022   AST 21 05/05/2022   ALKPHOS 49 05/05/2022   BILITOT 0.3 05/05/2022   Lab Results  Component  Value Date   HGBA1C 5.9 (H) 05/05/2022   HGBA1C 5.9 (H) 08/05/2021   HGBA1C 6.2 (H) 01/25/2021   Lab Results  Component Value Date   INSULIN 14.9 05/05/2022   INSULIN 12.5 08/05/2021   Lab Results  Component Value Date   TSH 1.090 08/05/2021   Lab Results  Component Value Date   CHOL 165 05/05/2022   HDL 52 05/05/2022   LDLCALC 103 (H) 05/05/2022   TRIG 48 05/05/2022   CHOLHDL 3.8 01/25/2021   Lab Results  Component Value Date   VD25OH 60.0 05/05/2022   VD25OH 43.8 08/05/2021   Lab Results  Component Value Date   WBC 5.4 08/06/2021   HGB 15.2 08/06/2021   HCT 46.7 08/06/2021   MCV 88.4 08/06/2021   PLT 230 08/06/2021   No results found for: "IRON", "TIBC", "FERRITIN"  Attestation Statements:   Reviewed by clinician on day of visit: allergies, medications, problem list, medical history, surgical history, family history, social history, and previous encounter notes.  I, Fortino Sic, RMA am acting as transcriptionist for Reuben Likes, MD.  I have reviewed the above documentation for accuracy and completeness, and I agree with the above. - Reuben Likes, MD

## 2022-05-29 ENCOUNTER — Other Ambulatory Visit (INDEPENDENT_AMBULATORY_CARE_PROVIDER_SITE_OTHER): Payer: Self-pay | Admitting: Family Medicine

## 2022-05-29 DIAGNOSIS — R632 Polyphagia: Secondary | ICD-10-CM

## 2022-06-01 ENCOUNTER — Encounter (INDEPENDENT_AMBULATORY_CARE_PROVIDER_SITE_OTHER): Payer: Self-pay | Admitting: Family Medicine

## 2022-06-04 ENCOUNTER — Other Ambulatory Visit (INDEPENDENT_AMBULATORY_CARE_PROVIDER_SITE_OTHER): Payer: Self-pay | Admitting: Family Medicine

## 2022-06-04 DIAGNOSIS — I10 Essential (primary) hypertension: Secondary | ICD-10-CM

## 2022-06-06 ENCOUNTER — Ambulatory Visit (INDEPENDENT_AMBULATORY_CARE_PROVIDER_SITE_OTHER): Payer: Managed Care, Other (non HMO) | Admitting: Family Medicine

## 2022-06-08 ENCOUNTER — Other Ambulatory Visit (INDEPENDENT_AMBULATORY_CARE_PROVIDER_SITE_OTHER): Payer: Self-pay | Admitting: Family Medicine

## 2022-06-08 DIAGNOSIS — I10 Essential (primary) hypertension: Secondary | ICD-10-CM

## 2022-06-13 ENCOUNTER — Other Ambulatory Visit (INDEPENDENT_AMBULATORY_CARE_PROVIDER_SITE_OTHER): Payer: Self-pay | Admitting: Family Medicine

## 2022-06-13 DIAGNOSIS — R632 Polyphagia: Secondary | ICD-10-CM

## 2022-06-14 ENCOUNTER — Other Ambulatory Visit (INDEPENDENT_AMBULATORY_CARE_PROVIDER_SITE_OTHER): Payer: Self-pay

## 2022-06-14 ENCOUNTER — Other Ambulatory Visit (INDEPENDENT_AMBULATORY_CARE_PROVIDER_SITE_OTHER): Payer: Self-pay | Admitting: Family Medicine

## 2022-06-14 DIAGNOSIS — R632 Polyphagia: Secondary | ICD-10-CM

## 2022-06-14 MED ORDER — TOPIRAMATE 25 MG PO TABS: 25.0000 mg | ORAL_TABLET | Freq: Two times a day (BID) | ORAL | 0 refills | Status: DC

## 2022-06-14 NOTE — Telephone Encounter (Signed)
Will check

## 2022-06-15 ENCOUNTER — Other Ambulatory Visit (INDEPENDENT_AMBULATORY_CARE_PROVIDER_SITE_OTHER): Payer: Self-pay

## 2022-06-15 NOTE — Telephone Encounter (Signed)
Dose change to 50mg 

## 2022-06-21 ENCOUNTER — Ambulatory Visit (INDEPENDENT_AMBULATORY_CARE_PROVIDER_SITE_OTHER): Payer: Managed Care, Other (non HMO) | Admitting: Family Medicine

## 2022-06-21 ENCOUNTER — Encounter (INDEPENDENT_AMBULATORY_CARE_PROVIDER_SITE_OTHER): Payer: Self-pay | Admitting: Family Medicine

## 2022-06-21 VITALS — BP 105/67 | HR 85 | Temp 97.8°F | Ht 70.0 in | Wt 312.0 lb

## 2022-06-21 DIAGNOSIS — R7303 Prediabetes: Secondary | ICD-10-CM

## 2022-06-21 DIAGNOSIS — Z6841 Body Mass Index (BMI) 40.0 and over, adult: Secondary | ICD-10-CM

## 2022-06-21 DIAGNOSIS — E669 Obesity, unspecified: Secondary | ICD-10-CM

## 2022-06-21 DIAGNOSIS — M170 Bilateral primary osteoarthritis of knee: Secondary | ICD-10-CM | POA: Diagnosis not present

## 2022-06-21 DIAGNOSIS — I10 Essential (primary) hypertension: Secondary | ICD-10-CM | POA: Diagnosis not present

## 2022-06-21 DIAGNOSIS — R632 Polyphagia: Secondary | ICD-10-CM | POA: Diagnosis not present

## 2022-06-21 MED ORDER — AMLODIPINE BESYLATE 5 MG PO TABS: 5.0000 mg | ORAL_TABLET | Freq: Every morning | ORAL | 0 refills | Status: DC

## 2022-06-21 MED ORDER — SEMAGLUTIDE(0.25 OR 0.5MG/DOS) 2 MG/3ML ~~LOC~~ SOPN: 0.2500 mg | PEN_INJECTOR | SUBCUTANEOUS | 0 refills | Status: DC

## 2022-06-22 ENCOUNTER — Encounter (INDEPENDENT_AMBULATORY_CARE_PROVIDER_SITE_OTHER): Payer: Self-pay

## 2022-06-22 MED ORDER — LOMAIRA 8 MG PO TABS: 8.0000 mg | ORAL_TABLET | Freq: Every morning | ORAL | 0 refills | Status: DC

## 2022-06-28 ENCOUNTER — Encounter (INDEPENDENT_AMBULATORY_CARE_PROVIDER_SITE_OTHER): Payer: Self-pay

## 2022-06-28 ENCOUNTER — Telehealth (INDEPENDENT_AMBULATORY_CARE_PROVIDER_SITE_OTHER): Payer: Self-pay | Admitting: Family Medicine

## 2022-06-28 NOTE — Telephone Encounter (Signed)
Dr. Bowen - Prior authorization denied for Ozempic. Per insurance: Patient does not have type 2 diabetes. Patient sent denial message via mychart.  

## 2022-06-28 NOTE — Progress Notes (Unsigned)
Chief Complaint:   OBESITY Calvin Hancock is here to discuss his progress with his obesity treatment plan along with follow-up of his obesity related diagnoses. Calvin Hancock is on {MWMwtlossportion/plan2:23431} and states he is following his eating plan approximately ***% of the time. Jeffren states he is *** *** minutes *** times per week.  Today's visit was #: *** Starting weight: *** Starting date: *** Today's weight: *** Today's date: 06/21/2022 Total lbs lost to date: *** Total lbs lost since last in-office visit: ***  Interim History: ***  Subjective:   1. Prediabetes ***  2. Essential hypertension ***  3. Polyphagia ***  4. Osteoarthritis of both knees, unspecified osteoarthritis type ***  Assessment/Plan:   1. Prediabetes *** - Semaglutide,0.25 or 0.5MG /DOS, 2 MG/3ML SOPN; Inject 0.25 mg into the skin once a week.  Dispense: 3 mL; Refill: 0  2. Essential hypertension *** - amLODipine (NORVASC) 5 MG tablet; Take 1 tablet (5 mg total) by mouth in the morning.  Dispense: 90 tablet; Refill: 0  3. Polyphagia *** - Phentermine HCl (LOMAIRA) 8 MG TABS; Take 8 mg by mouth every morning.  Dispense: 30 tablet; Refill: 0  4. Osteoarthritis of both knees, unspecified osteoarthritis type ***  5. Obesity, current BMI 44.9 Calvin Hancock {CHL AMB IS/IS NOT:210130109} currently in the action stage of change. As such, his goal is to {MWMwtloss#1:210800005}. He has agreed to {MWMwtlossportion/plan2:23431}.   Exercise goals: {MWM EXERCISE RECS:23473}  Behavioral modification strategies: {MWMwtlossdietstrategies3:23432}.  Calvin Hancock has agreed to follow-up with our clinic in {NUMBER 1-10:22536} weeks. He was informed of the importance of frequent follow-up visits to maximize his success with intensive lifestyle modifications for his multiple health conditions.   Objective:   Blood pressure 105/67, pulse 85, temperature 97.8 F (36.6 C), height 5\' 10"  (1.778 m), weight (!) 312 lb (141.5 kg),  SpO2 97 %. Body mass index is 44.77 kg/m.  General: Cooperative, alert, well developed, in no acute distress. HEENT: Conjunctivae and lids unremarkable. Cardiovascular: Regular rhythm.  Lungs: Normal work of breathing. Neurologic: No focal deficits.   Lab Results  Component Value Date   CREATININE 1.12 05/05/2022   BUN 21 05/05/2022   NA 146 (H) 05/05/2022   K 4.2 05/05/2022   CL 102 05/05/2022   CO2 28 05/05/2022   Lab Results  Component Value Date   ALT 23 05/05/2022   AST 21 05/05/2022   ALKPHOS 49 05/05/2022   BILITOT 0.3 05/05/2022   Lab Results  Component Value Date   HGBA1C 5.9 (H) 05/05/2022   HGBA1C 5.9 (H) 08/05/2021   HGBA1C 6.2 (H) 01/25/2021   Lab Results  Component Value Date   INSULIN 14.9 05/05/2022   INSULIN 12.5 08/05/2021   Lab Results  Component Value Date   TSH 1.090 08/05/2021   Lab Results  Component Value Date   CHOL 165 05/05/2022   HDL 52 05/05/2022   LDLCALC 103 (H) 05/05/2022   TRIG 48 05/05/2022   CHOLHDL 3.8 01/25/2021   Lab Results  Component Value Date   VD25OH 60.0 05/05/2022   VD25OH 43.8 08/05/2021   Lab Results  Component Value Date   WBC 5.4 08/06/2021   HGB 15.2 08/06/2021   HCT 46.7 08/06/2021   MCV 88.4 08/06/2021   PLT 230 08/06/2021   No results found for: "IRON", "TIBC", "FERRITIN"  Attestation Statements:   Reviewed by clinician on day of visit: allergies, medications, problem list, medical history, surgical history, family history, social history, and previous encounter notes.   I, ***,  am acting as transcriptionist for ***.  I have reviewed the above documentation for accuracy and completeness, and I agree with the above. -  ***

## 2022-07-04 ENCOUNTER — Encounter (INDEPENDENT_AMBULATORY_CARE_PROVIDER_SITE_OTHER): Payer: Self-pay

## 2022-07-10 ENCOUNTER — Other Ambulatory Visit (HOSPITAL_BASED_OUTPATIENT_CLINIC_OR_DEPARTMENT_OTHER): Payer: Self-pay | Admitting: Cardiovascular Disease

## 2022-07-14 ENCOUNTER — Ambulatory Visit (INDEPENDENT_AMBULATORY_CARE_PROVIDER_SITE_OTHER): Payer: Managed Care, Other (non HMO) | Admitting: Family Medicine

## 2022-07-14 ENCOUNTER — Encounter (INDEPENDENT_AMBULATORY_CARE_PROVIDER_SITE_OTHER): Payer: Self-pay | Admitting: Family Medicine

## 2022-07-14 VITALS — BP 108/74 | HR 80 | Temp 97.5°F | Wt 314.0 lb

## 2022-07-14 DIAGNOSIS — I10 Essential (primary) hypertension: Secondary | ICD-10-CM | POA: Diagnosis not present

## 2022-07-14 DIAGNOSIS — Z6841 Body Mass Index (BMI) 40.0 and over, adult: Secondary | ICD-10-CM

## 2022-07-14 DIAGNOSIS — E669 Obesity, unspecified: Secondary | ICD-10-CM

## 2022-07-14 DIAGNOSIS — R632 Polyphagia: Secondary | ICD-10-CM

## 2022-07-14 MED ORDER — LOMAIRA 8 MG PO TABS: 12.0000 mg | ORAL_TABLET | Freq: Every morning | ORAL | 0 refills | Status: DC

## 2022-07-14 MED ORDER — TOPIRAMATE 50 MG PO TABS: 75.0000 mg | ORAL_TABLET | Freq: Every day | ORAL | 0 refills | Status: DC

## 2022-07-20 NOTE — Progress Notes (Signed)
Chief Complaint:   OBESITY Baby is here to discuss his progress with his obesity treatment plan along with follow-up of his obesity related diagnoses. Calvin Hancock is on the Category 3 Plan and states he is following his eating plan approximately 75% of the time. [name] states he is exercising 0 minutes 0 times per week.  Today's visit was #: 15 Starting weight: 316 lbs Starting date: 08/05/2021 Today's weight: 314 lbs Today's date: 07/14/2022 Total lbs lost to date: 2 lbs Total lbs lost since last in-office visit: 0  Interim History: Calvin Hancock feeling frustrated with lack of change on scale. He could not get Ozempic (insurance denied). Has been eating eggs in am and sandwich with yogurt at lunch. He is having meat and vegetables for supper. Definitely getting quantity in. Eating Skinny pop for snack.  Subjective:   1. Polyphagia [name] has only been taking 8/50 of Lomaira/Topiramate, starting weight 314 needs to lose 16 lbs by beginning of Dec. PDMP (prescription drug monitoring program) checked.  2. Essential hypertension [name]'s blood pressure well controlled today. On Amlodipine, HCTZ and Benicar.  Assessment/Plan:   1. Polyphagia We will Refill/Increase Lomaira to 12 mg by mouth daily AND Topiramate to 75 mg daily for 1 month with 0 refills.  -Refill/increase Phentermine HCl (LOMAIRA) 8 MG TABS; Take 12 mg by mouth every morning.  Dispense: 45 tablet; Refill: 0  -Refill/increase topiramate (TOPAMAX) 50 MG tablet; Take 1.5 tablets (75 mg total) by mouth daily.  Dispense: 90 tablet; Refill: 0  2. Essential hypertension Continue current medications with no changes in doses.  3. Obesity, current BMI 45.1 Calvin Hancock is currently in the action stage of change. As such, his goal is to continue with weight loss efforts. He has agreed to the Category 3 Plan.   Exercise goals: No exercise has been prescribed at this time.  Behavioral modification strategies: increasing lean protein  intake, meal planning and cooking strategies, keeping healthy foods in the home, and planning for success.  Sheikh has agreed to follow-up with our clinic in 3 weeks. He was informed of the importance of frequent follow-up visits to maximize his success with intensive lifestyle modifications for his multiple health conditions.   Objective:   Blood pressure 108/74, pulse 80, temperature (!) 97.5 F (36.4 C), weight (!) 314 lb (142.4 kg), SpO2 99 %. Body mass index is 45.05 kg/m.  General: Cooperative, alert, well developed, in no acute distress. HEENT: Conjunctivae and lids unremarkable. Cardiovascular: Regular rhythm.  Lungs: Normal work of breathing. Neurologic: No focal deficits.   Lab Results  Component Value Date   CREATININE 1.12 05/05/2022   BUN 21 05/05/2022   NA 146 (H) 05/05/2022   K 4.2 05/05/2022   CL 102 05/05/2022   CO2 28 05/05/2022   Lab Results  Component Value Date   ALT 23 05/05/2022   AST 21 05/05/2022   ALKPHOS 49 05/05/2022   BILITOT 0.3 05/05/2022   Lab Results  Component Value Date   HGBA1C 5.9 (H) 05/05/2022   HGBA1C 5.9 (H) 08/05/2021   HGBA1C 6.2 (H) 01/25/2021   Lab Results  Component Value Date   INSULIN 14.9 05/05/2022   INSULIN 12.5 08/05/2021   Lab Results  Component Value Date   TSH 1.090 08/05/2021   Lab Results  Component Value Date   CHOL 165 05/05/2022   HDL 52 05/05/2022   LDLCALC 103 (H) 05/05/2022   TRIG 48 05/05/2022   CHOLHDL 3.8 01/25/2021   Lab Results  Component  Value Date   VD25OH 60.0 05/05/2022   VD25OH 43.8 08/05/2021   Lab Results  Component Value Date   WBC 5.4 08/06/2021   HGB 15.2 08/06/2021   HCT 46.7 08/06/2021   MCV 88.4 08/06/2021   PLT 230 08/06/2021   No results found for: "IRON", "TIBC", "FERRITIN"  Attestation Statements:   Reviewed by clinician on day of visit: allergies, medications, problem list, medical history, surgical history, family history, social history, and previous  encounter notes.  I, Fortino Sic, RMA am acting as transcriptionist for Reuben Likes, MD.  I have reviewed the above documentation for accuracy and completeness, and I agree with the above. - Reuben Likes, MD

## 2022-08-15 ENCOUNTER — Ambulatory Visit (INDEPENDENT_AMBULATORY_CARE_PROVIDER_SITE_OTHER): Payer: Managed Care, Other (non HMO) | Admitting: Family Medicine

## 2022-08-15 ENCOUNTER — Encounter (INDEPENDENT_AMBULATORY_CARE_PROVIDER_SITE_OTHER): Payer: Self-pay | Admitting: Family Medicine

## 2022-08-15 VITALS — BP 114/72 | HR 77 | Temp 97.7°F | Ht 70.0 in | Wt 314.0 lb

## 2022-08-15 DIAGNOSIS — R7303 Prediabetes: Secondary | ICD-10-CM

## 2022-08-15 DIAGNOSIS — I10 Essential (primary) hypertension: Secondary | ICD-10-CM

## 2022-08-15 DIAGNOSIS — R632 Polyphagia: Secondary | ICD-10-CM

## 2022-08-15 DIAGNOSIS — E669 Obesity, unspecified: Secondary | ICD-10-CM

## 2022-08-15 DIAGNOSIS — Z6841 Body Mass Index (BMI) 40.0 and over, adult: Secondary | ICD-10-CM

## 2022-08-15 MED ORDER — TOPIRAMATE 25 MG PO TABS: 50.0000 mg | ORAL_TABLET | Freq: Every day | ORAL | 0 refills | Status: DC

## 2022-08-16 NOTE — Progress Notes (Signed)
Chief Complaint:   OBESITY Calvin Hancock is here to discuss his progress with his obesity treatment plan along with follow-up of his obesity related diagnoses. Calvin Hancock is on the Category 3 Plan and states he is following his eating plan approximately 75% of the time. Calvin Hancock states he is walking more.   Today's visit was #: 16 Starting weight: 316 lbs Starting date: 08/05/2021 Today's weight: 314 lbs Today's date: 08/15/2022 Total lbs lost to date: 2 lbs Total lbs lost since last in-office visit: 0  Interim History: Calvin Hancock went to [place] recently and had never been there previously. Sticking to Cat 3 prior to his trip. Dinner has been a bit of a struggle as he is eating out more frequently. He started compounded Semaglutide. He has no plans for the upcoming weeks. Wants to get more on plan for dinner.   Subjective:   1. Polyphagia Calvin Hancock is on combo Phentermine and Topamax--he did not fill Rx from--8/31, did start compounded Semaglutide 10 ml weekly for 1 month then is supposed to increase to 20 ml.  2. Essential hypertension Calvin Hancock's blood pressure is well managed today. Denies chest pain, chest pressure and headache. He is on Norvasc, HTCZ and Benicar.   3. Prediabetes Last A1c was 5.9 ( 1 year ago was 6.2). On compounded Semaglutide.   Assessment/Plan:   1. Polyphagia Stop Lomaira; decrease Topamax to 50 mg daily for 1 week then 25 mg foe 1 week after then stop.  -Refill topiramate (TOPAMAX) 25 MG tablet; Take 2 tablets (50 mg total) by mouth daily.  Dispense: 21 tablet; Refill: 0  2. Essential hypertension Continue current medications, at goal.   3. Prediabetes Labs in 2 months.  4. Obesity, current BMI 45.1 Calvin Hancock is currently in the action stage of change. As such, his goal is to continue with weight loss efforts. He has agreed to the Category 3 Plan.   Exercise goals: No exercise has been prescribed at this time.  Behavioral modification strategies: increasing  lean protein intake, meal planning and cooking strategies, keeping healthy foods in the home, and planning for success.  Calvin Hancock has agreed to follow-up with our clinic in 3 weeks. He was informed of the importance of frequent follow-up visits to maximize his success with intensive lifestyle modifications for his multiple health conditions.   Objective:   Blood pressure 114/72, pulse 77, temperature 97.7 F (36.5 C), height 5\' 10"  (1.778 m), weight (!) 314 lb (142.4 kg), SpO2 97 %. Body mass index is 45.05 kg/m.  General: Cooperative, alert, well developed, in no acute distress. HEENT: Conjunctivae and lids unremarkable. Cardiovascular: Regular rhythm.  Lungs: Normal work of breathing. Neurologic: No focal deficits.   Lab Results  Component Value Date   CREATININE 1.12 05/05/2022   BUN 21 05/05/2022   NA 146 (H) 05/05/2022   K 4.2 05/05/2022   CL 102 05/05/2022   CO2 28 05/05/2022   Lab Results  Component Value Date   ALT 23 05/05/2022   AST 21 05/05/2022   ALKPHOS 49 05/05/2022   BILITOT 0.3 05/05/2022   Lab Results  Component Value Date   HGBA1C 5.9 (H) 05/05/2022   HGBA1C 5.9 (H) 08/05/2021   HGBA1C 6.2 (H) 01/25/2021   Lab Results  Component Value Date   INSULIN 14.9 05/05/2022   INSULIN 12.5 08/05/2021   Lab Results  Component Value Date   TSH 1.090 08/05/2021   Lab Results  Component Value Date   CHOL 165 05/05/2022  HDL 52 05/05/2022   LDLCALC 103 (H) 05/05/2022   TRIG 48 05/05/2022   CHOLHDL 3.8 01/25/2021   Lab Results  Component Value Date   VD25OH 60.0 05/05/2022   VD25OH 43.8 08/05/2021   Lab Results  Component Value Date   WBC 5.4 08/06/2021   HGB 15.2 08/06/2021   HCT 46.7 08/06/2021   MCV 88.4 08/06/2021   PLT 230 08/06/2021   No results found for: "IRON", "TIBC", "FERRITIN"  Attestation Statements:   Reviewed by clinician on day of visit: allergies, medications, problem list, medical history, surgical history, family history,  social history, and previous encounter notes.  I, Elnora Morrison, RMA am acting as transcriptionist for Coralie Common, MD.  I have reviewed the above documentation for accuracy and completeness, and I agree with the above. - Coralie Common, MD

## 2022-09-19 ENCOUNTER — Ambulatory Visit (INDEPENDENT_AMBULATORY_CARE_PROVIDER_SITE_OTHER): Payer: Managed Care, Other (non HMO) | Admitting: Family Medicine

## 2022-09-19 ENCOUNTER — Encounter (INDEPENDENT_AMBULATORY_CARE_PROVIDER_SITE_OTHER): Payer: Self-pay

## 2022-09-19 ENCOUNTER — Other Ambulatory Visit (INDEPENDENT_AMBULATORY_CARE_PROVIDER_SITE_OTHER): Payer: Self-pay | Admitting: Family Medicine

## 2022-09-19 DIAGNOSIS — I10 Essential (primary) hypertension: Secondary | ICD-10-CM

## 2022-10-03 ENCOUNTER — Other Ambulatory Visit (INDEPENDENT_AMBULATORY_CARE_PROVIDER_SITE_OTHER): Payer: Self-pay | Admitting: Family Medicine

## 2022-10-03 DIAGNOSIS — I10 Essential (primary) hypertension: Secondary | ICD-10-CM

## 2022-10-06 ENCOUNTER — Other Ambulatory Visit (HOSPITAL_BASED_OUTPATIENT_CLINIC_OR_DEPARTMENT_OTHER): Payer: Self-pay | Admitting: Cardiovascular Disease

## 2023-01-02 ENCOUNTER — Other Ambulatory Visit (INDEPENDENT_AMBULATORY_CARE_PROVIDER_SITE_OTHER): Payer: Self-pay | Admitting: Family Medicine

## 2023-01-02 DIAGNOSIS — I10 Essential (primary) hypertension: Secondary | ICD-10-CM

## 2023-01-06 ENCOUNTER — Other Ambulatory Visit: Payer: Self-pay | Admitting: Cardiovascular Disease

## 2023-01-12 ENCOUNTER — Encounter: Payer: Self-pay | Admitting: Family

## 2023-01-12 ENCOUNTER — Other Ambulatory Visit: Payer: Self-pay | Admitting: Family

## 2023-01-12 ENCOUNTER — Ambulatory Visit: Payer: Managed Care, Other (non HMO) | Admitting: Family

## 2023-01-12 VITALS — BP 128/78 | HR 75 | Resp 18 | Ht 70.0 in | Wt 307.0 lb

## 2023-01-12 DIAGNOSIS — I10 Essential (primary) hypertension: Secondary | ICD-10-CM | POA: Diagnosis not present

## 2023-01-12 DIAGNOSIS — R7303 Prediabetes: Secondary | ICD-10-CM | POA: Diagnosis not present

## 2023-01-12 DIAGNOSIS — Z13220 Encounter for screening for lipoid disorders: Secondary | ICD-10-CM

## 2023-01-12 DIAGNOSIS — M25552 Pain in left hip: Secondary | ICD-10-CM

## 2023-01-12 DIAGNOSIS — Z125 Encounter for screening for malignant neoplasm of prostate: Secondary | ICD-10-CM

## 2023-01-12 DIAGNOSIS — M25551 Pain in right hip: Secondary | ICD-10-CM

## 2023-01-12 DIAGNOSIS — J309 Allergic rhinitis, unspecified: Secondary | ICD-10-CM

## 2023-01-12 LAB — CBC WITH DIFFERENTIAL/PLATELET
Basophils Absolute: 0 10*3/uL (ref 0.0–0.1)
Basophils Relative: 0.7 % (ref 0.0–3.0)
Eosinophils Absolute: 0.1 10*3/uL (ref 0.0–0.7)
Eosinophils Relative: 0.9 % (ref 0.0–5.0)
HCT: 43.1 % (ref 39.0–52.0)
Hemoglobin: 14.5 g/dL (ref 13.0–17.0)
Lymphocytes Relative: 38.4 % (ref 12.0–46.0)
Lymphs Abs: 2.6 10*3/uL (ref 0.7–4.0)
MCHC: 33.5 g/dL (ref 30.0–36.0)
MCV: 87.4 fl (ref 78.0–100.0)
Monocytes Absolute: 0.6 10*3/uL (ref 0.1–1.0)
Monocytes Relative: 8.6 % (ref 3.0–12.0)
Neutro Abs: 3.5 10*3/uL (ref 1.4–7.7)
Neutrophils Relative %: 51.4 % (ref 43.0–77.0)
Platelets: 255 10*3/uL (ref 150.0–400.0)
RBC: 4.93 Mil/uL (ref 4.22–5.81)
RDW: 15.2 % (ref 11.5–15.5)
WBC: 6.9 10*3/uL (ref 4.0–10.5)

## 2023-01-12 LAB — COMPREHENSIVE METABOLIC PANEL
ALT: 23 U/L (ref 0–53)
AST: 26 U/L (ref 0–37)
Albumin: 4.1 g/dL (ref 3.5–5.2)
Alkaline Phosphatase: 42 U/L (ref 39–117)
BUN: 20 mg/dL (ref 6–23)
CO2: 31 mEq/L (ref 19–32)
Calcium: 9.7 mg/dL (ref 8.4–10.5)
Chloride: 99 mEq/L (ref 96–112)
Creatinine, Ser: 1.25 mg/dL (ref 0.40–1.50)
GFR: 65.2 mL/min (ref >60.00)
Glucose, Bld: 79 mg/dL (ref 70–99)
Potassium: 3.3 mEq/L — ABNORMAL LOW (ref 3.5–5.1)
Sodium: 141 mEq/L (ref 135–145)
Total Bilirubin: 0.6 mg/dL (ref 0.2–1.2)
Total Protein: 6.8 g/dL (ref 6.0–8.3)

## 2023-01-12 LAB — LIPID PANEL
Cholesterol: 154 mg/dL (ref 0–200)
HDL: 50.8 mg/dL (ref >39.00)
LDL Cholesterol: 90 mg/dL (ref 0–99)
NonHDL: 103.24
Total CHOL/HDL Ratio: 3
Triglycerides: 64 mg/dL (ref 0.0–149.0)
VLDL: 12.8 mg/dL (ref 0.0–40.0)

## 2023-01-12 LAB — PSA: PSA: 0.65 ng/mL (ref 0.10–4.00)

## 2023-01-12 LAB — HEMOGLOBIN A1C: Hgb A1c MFr Bld: 5.8 % (ref 4.6–6.5)

## 2023-01-12 MED ORDER — FLUTICASONE PROPIONATE 50 MCG/ACT NA SUSP: 2.0000 | Freq: Every day | NASAL | 6 refills | Status: AC

## 2023-01-12 MED ORDER — POTASSIUM CHLORIDE CRYS ER 10 MEQ PO TBCR: 10.0000 meq | EXTENDED_RELEASE_TABLET | Freq: Every day | ORAL | 0 refills | Status: AC

## 2023-01-12 NOTE — Progress Notes (Signed)
Calvin Hancock is a 57 y.o. male with the following history as recorded in EpicCare:  Patient Active Problem List   Diagnosis Date Noted   Myopericarditis 01/27/2021   HTN (hypertension) 01/24/2021   OSA (obstructive sleep apnea) 01/24/2021   Elevated troponin level not due myocardial infarction, +chest pain 01/23/2021   Severe obesity (BMI >= 40) (HCC) 12/15/2015   Dyspnea 12/14/2015    Current Outpatient Medications  Medication Sig Dispense Refill   ALLOPURINOL PO Take 200 mg by mouth daily.     amLODipine (NORVASC) 5 MG tablet TAKE 1 TABLET (5 MG TOTAL) BY MOUTH IN THE MORNING 90 tablet 0   Cholecalciferol (VITAMIN D3) 50 MCG (2000 UT) CAPS Take by mouth.     etodolac (LODINE) 400 MG tablet Take 400 mg by mouth 2 (two) times daily.     fluticasone (FLONASE) 50 MCG/ACT nasal spray Place 2 sprays into both nostrils daily. 16 g 6   hydrochlorothiazide (HYDRODIURIL) 25 MG tablet Take 1 tablet (25 mg total) by mouth daily. PATIENT MUST SCHEDULE APPOINTMENT FOR FUTURE REFILLS FIRST ATTEMPT 30 tablet 0   HYDROcodone-acetaminophen (NORCO/VICODIN) 5-325 MG tablet Take 1 tablet by mouth every 6 (six) hours as needed for moderate pain.     Multiple Vitamin (MULTIVITAMIN WITH MINERALS) TABS tablet Take 1 tablet by mouth in the morning.     olmesartan (BENICAR) 40 MG tablet Take 20 mg by mouth every morning.     pantoprazole (PROTONIX) 40 MG tablet TAKE 1 TABLET BY MOUTH EVERY DAY 90 tablet 3   Semaglutide-Weight Management 1 MG/0.5ML SOAJ Inject 1 mg into the skin. Per patient, having compounded through pharmacy in [place]     furosemide (LASIX) 20 MG tablet Take 1 tablet (20 mg total) by mouth daily. 90 tablet 3   No current facility-administered medications for this visit.    Allergies: Patient has no known allergies.  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[place])    Prediabetes    Sleep apnea     Past Surgical History:  Procedure Laterality Date   LEFT HEART CATH AND CORONARY ANGIOGRAPHY N/A 01/25/2021   Procedure: LEFT HEART CATH AND CORONARY ANGIOGRAPHY;  Surgeon: Nelva Bush, MD;  Location: [place] [facility];  Service: Cardiovascular;  Laterality: N/A;   PATELLA FRACTURE SURGERY      Family History  Problem Relation Age of Onset   Heart disease Mother    Hypertension Mother    Hyperlipidemia Father    Hypertension Father    Diabetes Father    Heart disease Father    Sudden death Father    Sleep apnea Father    Alcoholism Father     Social History   Tobacco Use   Smoking status: Never   Smokeless tobacco: Never  Substance Use Topics   Alcohol use: Yes    Alcohol/week: 0.0 standard drinks of alcohol    Comment: social    Subjective:    Presents today as a new patient; history of hypertension- sees cardiology regularly; per patient, his cardiologist is having him take both Lasix and HCTZ for fluid retention; takes Amlodipine and Olmesartan for his blood pressure;   Needing to get scheduled for hip replacements- has to get to about 285 pounds to do surgery; working with  provider in [place] and taking compounded Ozempic; has lost about 20 pounds;   Does have sleep apnea- managed by [facility] sleep clinic;   Rheumatology managing gout- [facility];  Objective:  Vitals:   01/12/23 0852  BP: 128/78  Pulse: 75  Resp: 18  SpO2: 97%  Weight: (!) 307 lb (139.3 kg)  Height: '5\' 10"'$  (1.778 m)    General: Well developed, well nourished, in no acute distress  Skin : Warm and dry.  Head: Normocephalic and atraumatic  Eyes: Sclera and conjunctiva clear; pupils round and reactive to light; extraocular movements intact  Ears: External normal; canals clear; tympanic membranes normal  Oropharynx: Pink, supple. No suspicious lesions  Neck: Supple without thyromegaly,  adenopathy  Lungs: Respirations unlabored; clear to auscultation bilaterally without wheeze, rales, rhonchi  CVS exam: normal rate and regular rhythm.  Abdomen: Soft; nontender; nondistended; normoactive bowel sounds; no masses or hepatosplenomegaly  Musculoskeletal: No deformities; no active joint inflammation  Extremities: No edema, cyanosis, clubbing  Vessels: Symmetric bilaterally  Neurologic: Alert and oriented; speech intact; face symmetrical; moves all extremities well; CNII-XII intact without focal deficit   Assessment:  1. Essential hypertension   2. Pre-diabetes   3. Lipid screening   4. Prostate cancer screening   5. Bilateral hip pain   6. Allergic rhinitis, unspecified seasonality, unspecified trigger     Plan:  Stable; he does not need refills updated; check labs today; stressed need to follow up with his cardiologist; Check Hgba1c today; already on compounded Ozempic;  Check lipid panel; Check PSA; Keep planned follow up with orthopedics- hopefully will be able to get surgery soon;  Trial of Flonase NS;     No follow-ups on file.  Orders Placed This Encounter  Procedures   CBC with Differential/Platelet   Comp Met (CMET)   Lipid panel   Hemoglobin A1c   PSA    Requested Prescriptions   Signed Prescriptions Disp Refills   fluticasone (FLONASE) 50 MCG/ACT nasal spray 16 g 6    Sig: Place 2 sprays into both nostrils daily.

## 2023-01-12 NOTE — Patient Instructions (Signed)
Please schedule follow up with your cardiologist and [facility];

## 2023-01-29 ENCOUNTER — Other Ambulatory Visit: Payer: Self-pay | Admitting: Cardiovascular Disease

## 2023-02-04 ENCOUNTER — Other Ambulatory Visit: Payer: Self-pay | Admitting: Cardiovascular Disease

## 2023-02-25 ENCOUNTER — Other Ambulatory Visit: Payer: Self-pay | Admitting: Cardiovascular Disease

## 2023-03-01 ENCOUNTER — Other Ambulatory Visit: Payer: Self-pay | Admitting: Cardiovascular Disease

## 2023-03-09 ENCOUNTER — Other Ambulatory Visit: Payer: Self-pay | Admitting: Cardiovascular Disease

## 2023-03-19 ENCOUNTER — Other Ambulatory Visit: Payer: Self-pay | Admitting: Cardiovascular Disease

## 2023-04-24 ENCOUNTER — Other Ambulatory Visit: Payer: Self-pay | Admitting: Cardiovascular Disease

## 2023-05-04 ENCOUNTER — Other Ambulatory Visit: Payer: Self-pay | Admitting: Cardiovascular Disease

## 2023-05-08 ENCOUNTER — Other Ambulatory Visit: Payer: Self-pay | Admitting: Cardiovascular Disease

## 2023-05-09 NOTE — Telephone Encounter (Signed)
Patient has requested hydrochlorothiazide (HYDRODIURIL) 25 MG tablet. Pt has already had final refill attempt. Pt is over due for follow-up visit.

## 2023-05-11 NOTE — Telephone Encounter (Signed)
Rx(s) sent to pharmacy electronically.  

## 2023-05-15 ENCOUNTER — Telehealth: Payer: Self-pay | Admitting: Cardiovascular Disease

## 2023-05-15 MED ORDER — HYDROCHLOROTHIAZIDE 25 MG PO TABS: 25.0000 mg | ORAL_TABLET | Freq: Every day | ORAL | 0 refills | Status: DC

## 2023-05-15 NOTE — Telephone Encounter (Signed)
Pt scheduled to see Azalee Course, PA-C 05/25/23, sent in 15 day supply hydrochlorothiazide.

## 2023-05-15 NOTE — Telephone Encounter (Signed)
*  STAT* If patient is at the pharmacy, call can be transferred to refill team.   1. Which medications need to be refilled? (please list name of each medication and dose if known)   hydrochlorothiazide (HYDRODIURIL) 25 MG tablet    2. Which pharmacy/location (including street and city if local pharmacy) is medication to be sent to?[id number] - [place], [place] - [address]   3. Do they need a 30 day or 90 day supply? 90 day

## 2023-05-25 ENCOUNTER — Ambulatory Visit: Payer: Managed Care, Other (non HMO) | Attending: Physician Assistant | Admitting: Physician Assistant

## 2023-05-25 VITALS — BP 126/74 | HR 80 | Ht 70.5 in | Wt 321.8 lb

## 2023-05-25 DIAGNOSIS — I10 Essential (primary) hypertension: Secondary | ICD-10-CM | POA: Diagnosis not present

## 2023-05-25 DIAGNOSIS — I319 Disease of pericardium, unspecified: Secondary | ICD-10-CM | POA: Diagnosis not present

## 2023-05-25 DIAGNOSIS — I5032 Chronic diastolic (congestive) heart failure: Secondary | ICD-10-CM

## 2023-05-25 MED ORDER — AMLODIPINE BESYLATE 5 MG PO TABS
5.0000 mg | ORAL_TABLET | Freq: Every morning | ORAL | 3 refills | Status: AC
Start: 2023-05-25 — End: ?

## 2023-05-25 MED ORDER — HYDROCHLOROTHIAZIDE 25 MG PO TABS: 25.0000 mg | ORAL_TABLET | Freq: Every day | ORAL | 3 refills | Status: AC

## 2023-05-25 MED ORDER — FUROSEMIDE 20 MG PO TABS: 20.0000 mg | ORAL_TABLET | Freq: Every day | ORAL | Status: AC | PRN

## 2023-05-25 NOTE — Progress Notes (Signed)
Cardiology Office Note:  .   Date:  05/25/2023  ID:  Calvin Hancock, DOB [birth year], MRN [id number] PCP: Olive Bass, FNP  [facility] Providers Cardiologist:  Reatha Harps, MD     History of Present Illness: .   Calvin Hancock is a 57 y.o. male with past medical history of diastolic heart failure, myopericarditis, obesity and OSA.  Patient was previously admitted with myopericarditis in March 2022, cardiac catheterization at the time showed normal coronary arteries.  Cardiac MRI confirmed a myopericarditis.  Echocardiogram obtained on 03/01/2022 showed EF 55 to 60%, no regional wall motion abnormality, trivial MR.  He has not had any recurrence since then.  He was last seen by Dr. Flora Lipps in January 2023 at which time he was doing well.  Patient presents today for follow-up.  He denies any chest pain or shortness of breath.  He has no lower extremity edema, orthopnea or PND.  I will change his Lasix to as needed only.  He is still has significant arthritis issues.  I will refill his home blood pressure medication.  His blood pressure and heart rate are under good control.  He can follow-up in 1 year.  ROS:   He denies chest pain, palpitations, dyspnea, pnd, orthopnea, n, v, dizziness, syncope, edema, weight gain, or early satiety. All other systems reviewed and are otherwise negative except as noted above.    Studies Reviewed: .        Cardiac Studies & Procedures   CARDIAC CATHETERIZATION  CARDIAC CATHETERIZATION 01/25/2021  Narrative Conclusions: 1. No angiographically significant coronary artery disease. 2. Low normal left ventricular contraction with moderately elevated filling pressure (LVEDP 25-30 mmHg).  Recommendations: 1. Consider cardiac MRI to evaluate for evidence of myocarditis/myopericarditis. 2. Gentle diuresis in the setting of elevated LVEDP and low normal LVEF consistent with acute HFpEF. 3. Primary prevention of coronary artery  disease.  [name], MD [facility]  Findings Coronary Findings Diagnostic  Dominance: Right  Left Main Vessel is large. Vessel is angiographically normal.  Left Anterior Descending Vessel is large. Vessel is angiographically normal.  First Diagonal Branch Vessel is small in size.  Second Diagonal Branch Vessel is small in size.  Third Diagonal Branch Vessel is small in size.  Left Circumflex Vessel is large. Vessel is angiographically normal.  First Obtuse Marginal Branch Vessel is moderate in size.  Lateral First Obtuse Marginal Branch Vessel is small in size.  Second Obtuse Marginal Branch Vessel is small in size.  Third Obtuse Marginal Branch Vessel is large in size.  Fourth Obtuse Marginal Branch Vessel is moderate in size.  Right Coronary Artery Vessel is moderate in size. Vessel is angiographically normal.  Right Posterior Descending Artery Vessel is moderate in size.  Right Posterior Atrioventricular Artery Vessel is moderate in size.  Intervention  No interventions have been documented.     ECHOCARDIOGRAM  ECHOCARDIOGRAM COMPLETE 03/01/2022  Narrative ECHOCARDIOGRAM REPORT    Patient Name:   Calvin Hancock Date of Exam: 03/01/2022 Medical Rec #:  [id number]       Height:       70.0 in Accession #:    [id number]      Weight:       303.0 lb Date of Birth:  [birth year]       BSA:          2.490 m Patient Age:    55 years        BP:  128/71 mmHg Patient Gender: M               HR:           71 bpm. Exam Location:  [address]  Procedure: 2D Echo, Cardiac Doppler, Color Doppler and Intracardiac Opacification Agent  Indications:    I50.32 Chronic diastolic heart failure  History:        Patient has prior history of Echocardiogram examinations, most recent 01/24/2021. Risk Factors:Sleep Apnea, Hypertension and Obesity. Pre-diabetes. History of myocarditis.  Sonographer:    Daphine Deutscher RDCS Referring Phys:  [id number] Ronnald Ramp O'NEAL  IMPRESSIONS   1. Left ventricular ejection fraction, by estimation, is 55 to 60%. The left ventricle has normal function. The left ventricle has no regional wall motion abnormalities. Left ventricular diastolic parameters are consistent with Grade I diastolic dysfunction (impaired relaxation). 2. Right ventricular systolic function is normal. The right ventricular size is normal. 3. The mitral valve is normal in structure. Trivial mitral valve regurgitation. No evidence of mitral stenosis. 4. The aortic valve is tricuspid. Aortic valve regurgitation is not visualized. Aortic valve sclerosis is present, with no evidence of aortic valve stenosis.  Comparison(s): No significant change from prior study.  FINDINGS Left Ventricle: Left ventricular ejection fraction, by estimation, is 55 to 60%. The left ventricle has normal function. The left ventricle has no regional wall motion abnormalities. Definity contrast agent was given IV to delineate the left ventricular endocardial borders. The left ventricular internal cavity size was normal in size. There is no left ventricular hypertrophy. Left ventricular diastolic parameters are consistent with Grade I diastolic dysfunction (impaired relaxation).  Right Ventricle: The right ventricular size is normal. Right ventricular systolic function is normal.  Left Atrium: Left atrial size was normal in size.  Right Atrium: Right atrial size was normal in size.  Pericardium: There is no evidence of pericardial effusion.  Mitral Valve: The mitral valve is normal in structure. Trivial mitral valve regurgitation. No evidence of mitral valve stenosis.  Tricuspid Valve: The tricuspid valve is normal in structure. Tricuspid valve regurgitation is trivial. No evidence of tricuspid stenosis.  Aortic Valve: The aortic valve is tricuspid. Aortic valve regurgitation is not visualized. Aortic valve sclerosis is present, with no evidence  of aortic valve stenosis.  Pulmonic Valve: The pulmonic valve was normal in structure. Pulmonic valve regurgitation is trivial. No evidence of pulmonic stenosis.  Aorta: The aortic root is normal in size and structure.  Venous: The inferior vena cava was not well visualized.  IAS/Shunts: The interatrial septum was not well visualized.   LEFT VENTRICLE PLAX 2D LVIDd:         5.10 cm   Diastology LVIDs:         3.30 cm   LV e' medial:    6.71 cm/s LV PW:         1.00 cm   LV E/e' medial:  10.8 LV IVS:        1.00 cm   LV e' lateral:   8.21 cm/s LVOT diam:     2.50 cm   LV E/e' lateral: 8.8 LV SV:         97 LV SV Index:   39 LVOT Area:     4.91 cm   RIGHT VENTRICLE RV Basal diam:  4.20 cm RV S prime:     7.95 cm/s  LEFT ATRIUM             Index  RIGHT ATRIUM           Index LA diam:        4.20 cm 1.69 cm/m   RA Area:     11.70 cm LA Vol (A2C):   57.3 ml 23.01 ml/m  RA Volume:   26.70 ml  10.72 ml/m LA Vol (A4C):   48.4 ml 19.43 ml/m LA Biplane Vol: 55.3 ml 22.21 ml/m AORTIC VALVE LVOT Vmax:   101.37 cm/s LVOT Vmean:  65.100 cm/s LVOT VTI:    0.198 m  AORTA Ao Root diam: 3.70 cm Ao Asc diam:  3.20 cm  MITRAL VALVE MV Area (PHT): 3.62 cm    SHUNTS MV Decel Time: 210 msec    Systemic VTI:  0.20 m MV E velocity: 72.25 cm/s  Systemic Diam: 2.50 cm MV A velocity: 65.25 cm/s MV E/A ratio:  1.11  Olga Millers MD Electronically signed by Olga Millers MD Signature Date/Time: 03/01/2022/3:57:16 PM    Final    MONITORS  LONG TERM MONITOR (3-14 DAYS) 02/22/2021  Narrative Enrollment 02/08/2021-02/15/2021 (7 days 7 hours). Patient had a min HR of 44 bpm (sinus bradycardia), max HR of 190 bpm (8.7 seconds of nonsustained ventricular tachycardia), and avg HR of 76 bpm (normal sinus rhythm). Predominant underlying rhythm was Sinus Rhythm. 1 run of non-sustained Ventricular Tachycardia occurred lasting 8.7 secs (23 beat duration) with a max rate of 190 bpm (avg  163 bpm). Isolated SVEs were rare (<1.0%), and no SVE Couplets or SVE Triplets were present. Isolated VEs were occasional (1.3%, 9934), VE Couplets were rare (<1.0%, 10), and no VE Triplets were present. Ventricular Bigeminy and Trigeminy were present.  02/08/21 05:30pm Chest pain or pressure coincided with normal sinus rhythm 77 bpm.  Impression: 1. Brief non-sustained ventricular tachycardia detected (1 episode; 8.7 second duration). 2. Occasional PVCs (1.3% burden).  Gerri Spore T. Flora Lipps, MD, [facility]  [facility] [address] [place], [place] [address] [phone] 10:15 AM    CARDIAC MRI  MR CARDIAC MORPHOLOGY W WO CONTRAST 01/26/2021  Narrative CLINICAL DATA:  Clinical question of myocarditis 57 year-old African American Male  Study assumes HCT 87  EXAM: CARDIAC MRI  TECHNIQUE: The patient was scanned on a 1.5 Tesla GE magnet. A dedicated cardiac coil was used. Functional imaging was done using Fiesta sequences. 2,3, and 4 chamber views were done to assess for RWMA's. Modified Simpson's rule using a short axis stack was used to calculate an ejection fraction on a dedicated work [employer]. The patient received 17 cc of Gadavist. After 10 minutes inversion recovery sequences were used to assess for infiltration and scar tissue.  CONTRAST:  17 cc  of Gadavist  FINDINGS: 1. Normal left ventricular size, with LVEDD 53 mm, and LVEDVi 75 mL/m2.  Normal left ventricular thickness, with intraventricular septal thickness of 7 mm, posterior wall thickness of 9 mm, and septal to posterior ratio < 1.5.  Normal left ventricular systolic function (LVEF =55%). There are no regional wall motion abnormalities.  Left ventricular parametric mapping notable for elevated T2 signal, most prominent in the inferolateral wall (68 msec in the base 61 msec in the mid) with concomitant elevation in native T1 signal (1262 msec and 1168  msec).  There is late gadolinium enhancement in the left ventricular myocardium: Mid-myocardial up to 75% of myocardium in the basal inferolateral wall, Mid-myocardial up to 50% of myocardium in the mid inferolateral wall.  2. Normal right ventricular size with RVEDVI 90  mL/m2.  Normal right ventricular thickness.  Normal right ventricular systolic function (RVEF =46%). There are no regional wall motion abnormalities or aneurysms.  3. Normal left and right atrial size, with LAESV 54 mL and RAESV 52 mL.  4. Normal size of the aortic root, ascending aorta and pulmonary artery.  5.  No significant valvular abnormalities.  6. Increased pericardial thickening: 6 mm. No pericardial effusion. Gadolinium hyper-enhancement of pericardium of pericardium over-lying the basal inferolateral wall. No qualitative evidence of increase T2 signal.  7. Grossly, no extracardiac findings. Recommended dedicated study if concerned for non-cardiac pathology.  8.  Motion artifact noted.  IMPRESSION: 1.  Normal left ventricular function, LVEF 55%.  2. Parametric mapping and gadolinium enhancement pattern consistent with myocarditis; modified [facility] Criteria met.  3. Increase in pericardial thickening and enhancement consistent with pericarditis.  4.  Study consistent with myopericarditis.  Riley Lam MD   Electronically Signed By: Riley Lam MD On: 01/27/2021 07:14         Risk Assessment/Calculations:             Physical Exam:   VS:  BP 126/74   Pulse 80   Ht 5' 10.5" (1.791 m)   Wt (!) 321 lb 12.8 oz (146 kg)   SpO2 94%   BMI 45.52 kg/m    Wt Readings from Last 3 Encounters:  05/25/23 (!) 321 lb 12.8 oz (146 kg)  01/12/23 (!) 307 lb (139.3 kg)  08/15/22 (!) 314 lb (142.4 kg)    GEN: Well nourished, well developed in no acute distress NECK: No JVD; No carotid bruits CARDIAC: RRR, no murmurs, rubs, gallops RESPIRATORY:  Clear to auscultation  without rales, wheezing or rhonchi  ABDOMEN: Soft, non-tender, non-distended EXTREMITIES:  No edema; No deformity   ASSESSMENT AND PLAN: .    Myopericarditis: No recurrence recently.  Hypertension: Blood pressure stable on current therapy  Chronic solid heart failure: He appears to be euvolemic on exam.  Patient inquired about stopping Lasix, I think this is quite reasonable.  I will change Lasix to as needed only.       Dispo: Follow-up with Dr. Flora Lipps in 1 year  Signed, Azalee Course, [place]

## 2023-05-25 NOTE — Patient Instructions (Signed)
Medication Instructions:  Your physician has recommended you make the following change in your medication:   CHANGE the Lasix to only as needed  When you get home, check the Olmesartan bottle and call and let us know what dose you are taking and we will be glad to refill it.  *If you need a refill on your cardiac medications before your next appointment, please call your pharmacy*   Lab Work: None ordered  If you have labs (blood work) drawn today and your tests are completely normal, you will receive your results only by: MyChart Message (if you have MyChart) OR A paper copy in the mail If you have any lab test that is abnormal or we need to change your treatment, we will call you to review the results.   Testing/Procedures: None ordered   Follow-Up: At [name], you and your health needs are our priority.  As part of our continuing mission to provide you with exceptional heart care, we have created designated Provider Care Teams.  These Care Teams include your primary Cardiologist (physician) and Advanced Practice Providers (APPs -  Physician Assistants and Nurse Practitioners) who all work together to provide you with the care you need, when you need it.  We recommend signing up for the patient portal called "MyChart".  Sign up information is provided on this After Visit Summary.  MyChart is used to connect with patients for Virtual Visits (Telemedicine).  Patients are able to view lab/test results, encounter notes, upcoming appointments, etc.  Non-urgent messages can be sent to your provider as well.   To learn more about what you can do with MyChart, go to [email or web address].    Your next appointment:   1 year(s)  Provider:   Reatha Harps, MD     Other Instructions

## 2023-09-20 ENCOUNTER — Other Ambulatory Visit (HOSPITAL_COMMUNITY): Payer: Self-pay
